# Patient Record
Sex: Female | Born: 1935 | Race: White | Hispanic: No | Smoking: Never smoker
Health system: Southern US, Community
[De-identification: ages and names within clinical notes are randomized; demographics above are authoritative.]

## PROBLEM LIST (undated history)

## (undated) DIAGNOSIS — I10 Essential (primary) hypertension: Secondary | ICD-10-CM

## (undated) DIAGNOSIS — R0989 Other specified symptoms and signs involving the circulatory and respiratory systems: Secondary | ICD-10-CM

## (undated) DIAGNOSIS — M8580 Other specified disorders of bone density and structure, unspecified site: Secondary | ICD-10-CM

## (undated) DIAGNOSIS — E7800 Pure hypercholesterolemia, unspecified: Secondary | ICD-10-CM

## (undated) HISTORY — DX: Other specified symptoms and signs involving the circulatory and respiratory systems: R09.89

## (undated) HISTORY — DX: Other specified disorders of bone density and structure, unspecified site: M85.80

## (undated) HISTORY — DX: Pure hypercholesterolemia, unspecified: E78.00

## (undated) HISTORY — DX: Essential (primary) hypertension: I10

---
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
## 1968-08-24 HISTORY — PX: OTHER SURGICAL HISTORY: SHX169

## 1998-08-24 LAB — HM DEXA SCAN

## 2010-05-27 LAB — HM PAP SMEAR: HM Pap smear: NORMAL

## 2011-05-14 ENCOUNTER — Ambulatory Visit: Payer: Self-pay | Admitting: Family Medicine

## 2011-05-14 IMAGING — US US CAROTID DUPLEX BILAT
1 series · 14 of 24 positions shown · non-contrast
Comparison: none

REASON FOR EXAM: left bruit
COMMENTS:

[Series 1: us carotid duplex bilat · 0.07mm/px · 14 of 47 slices shown]
[im 1/47]
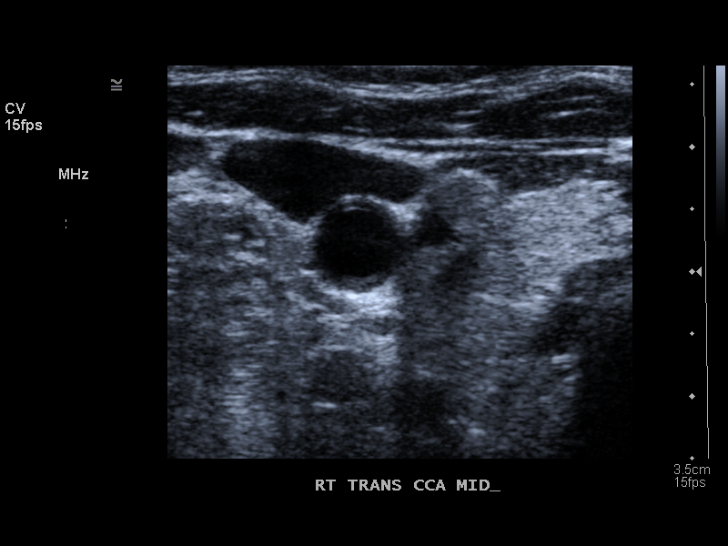
[im 5/47]
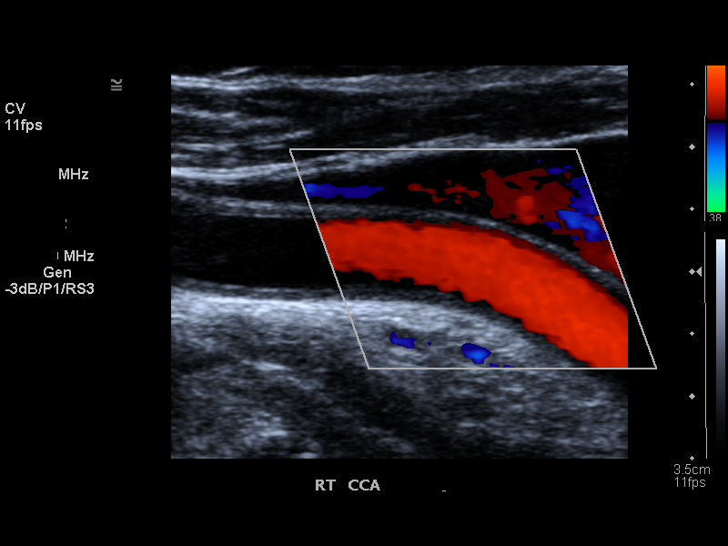
[im 9/47]
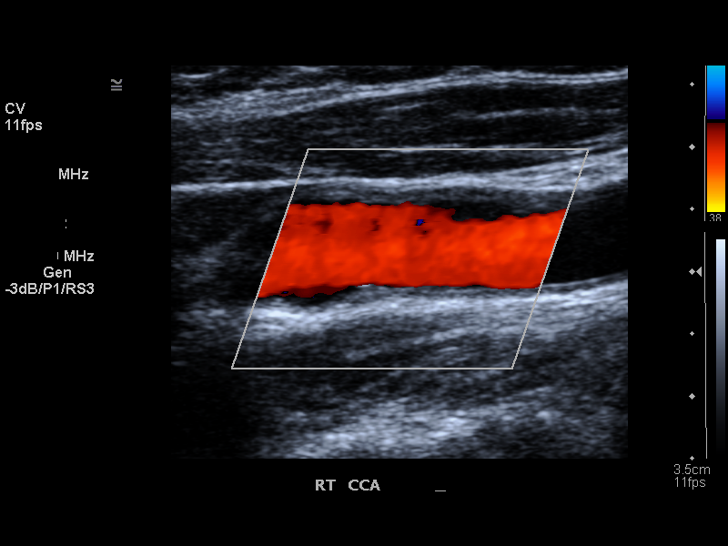
[im 13/47]
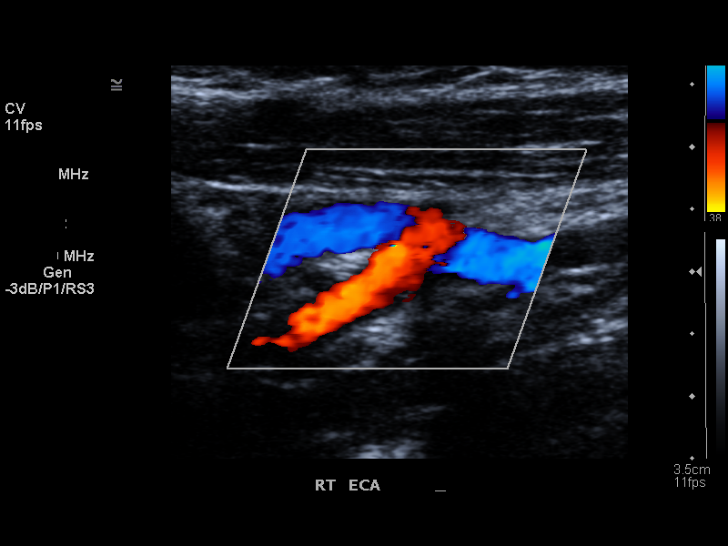
[im 15/47]
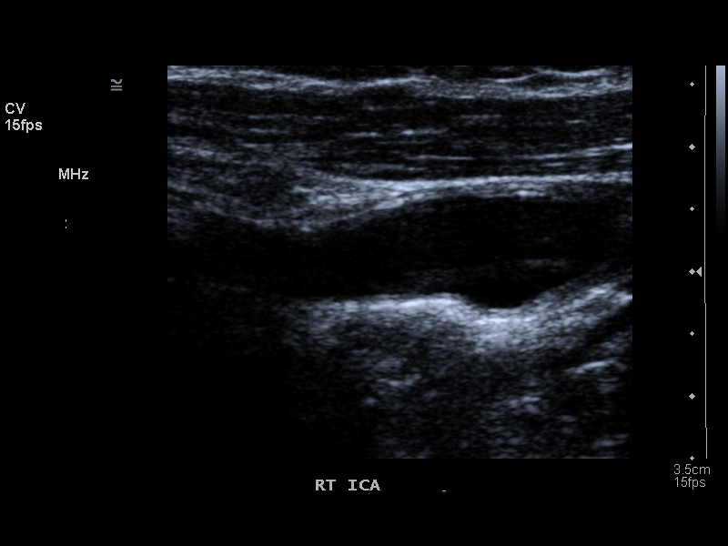
[im 19/47]
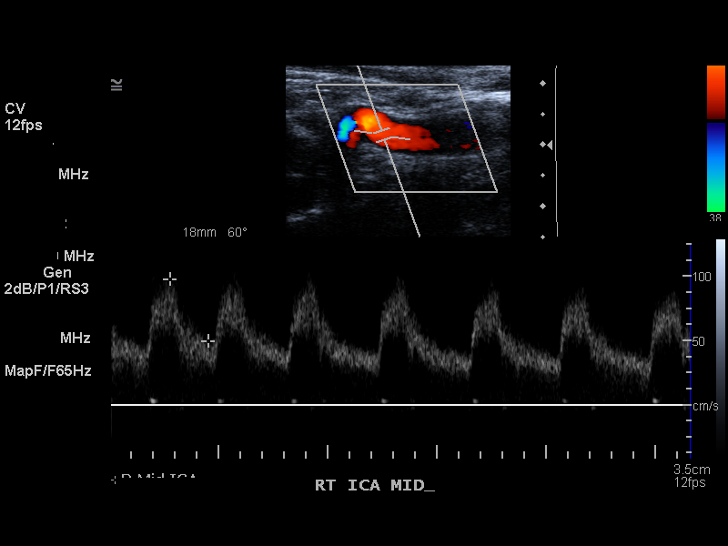
[im 23/47]
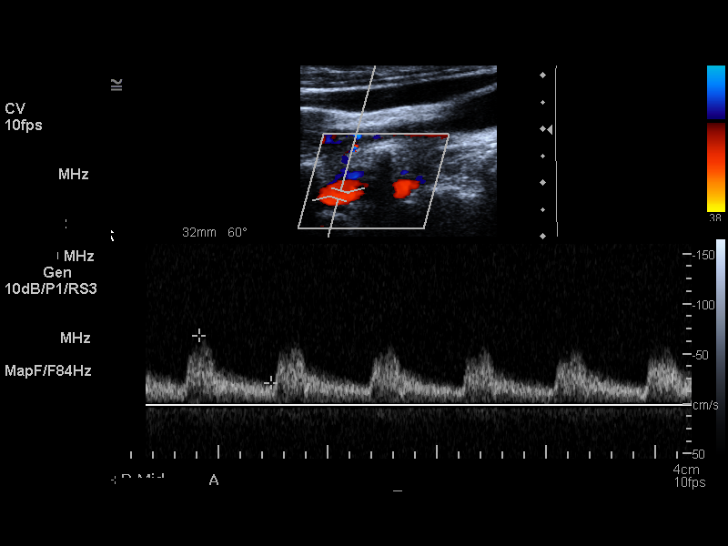
[im 25/47]
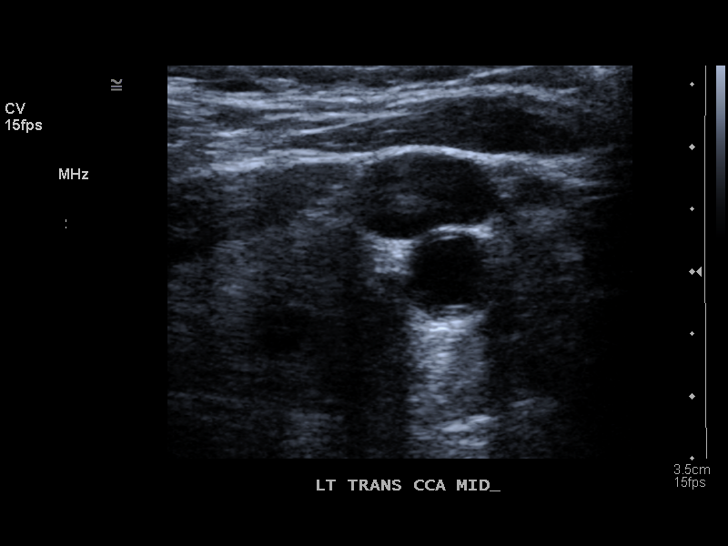
[im 29/47]
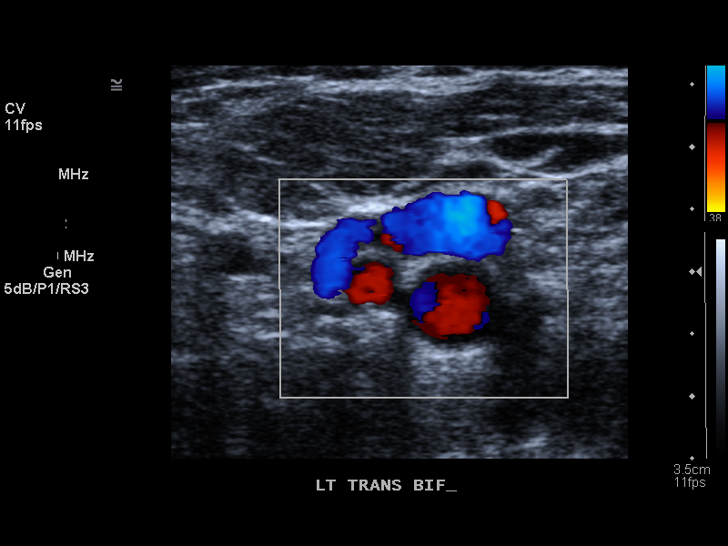
[im 33/47]
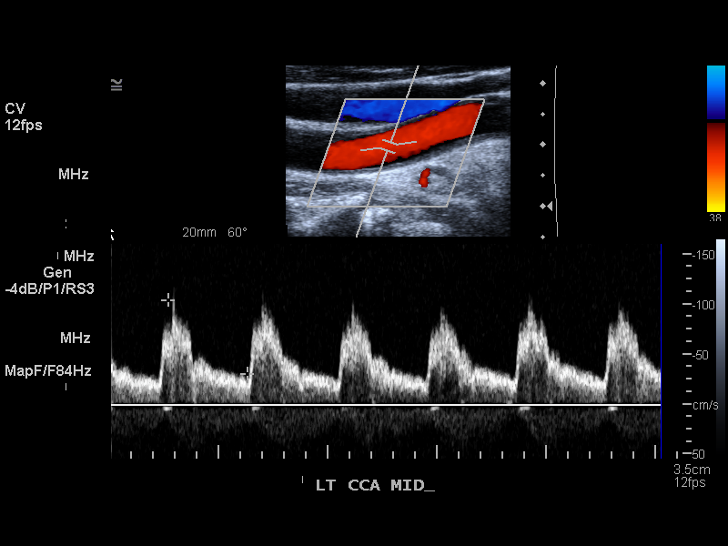
[im 37/47]
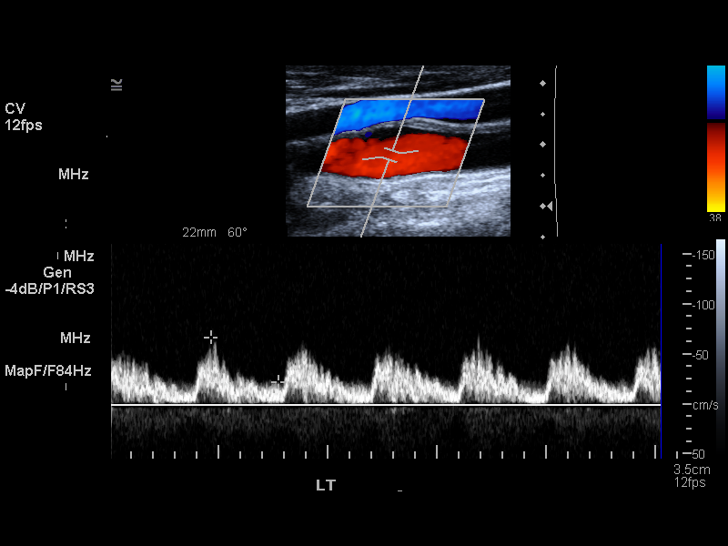
[im 39/47]
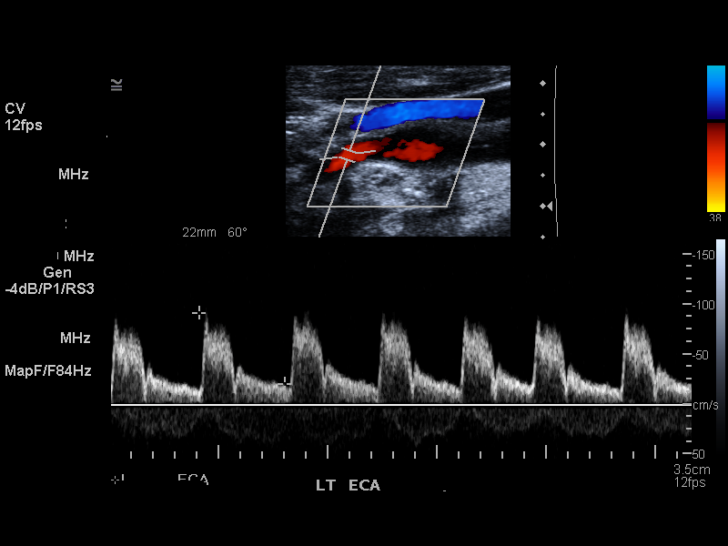
[im 43/47]
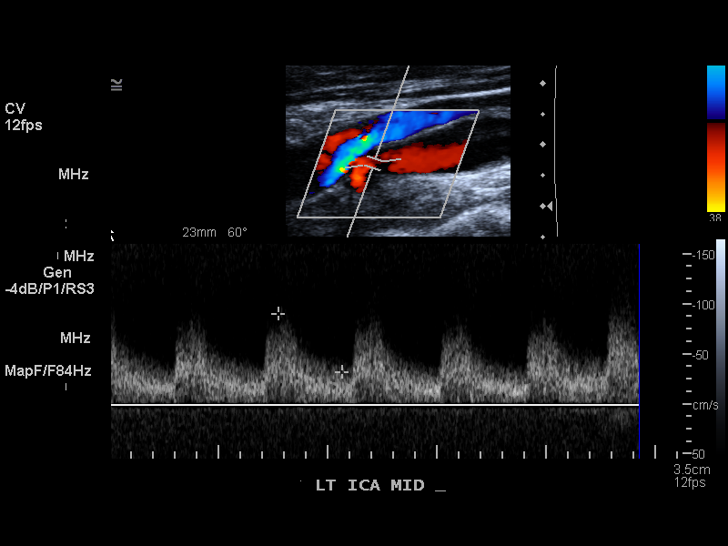
[im 47/47]
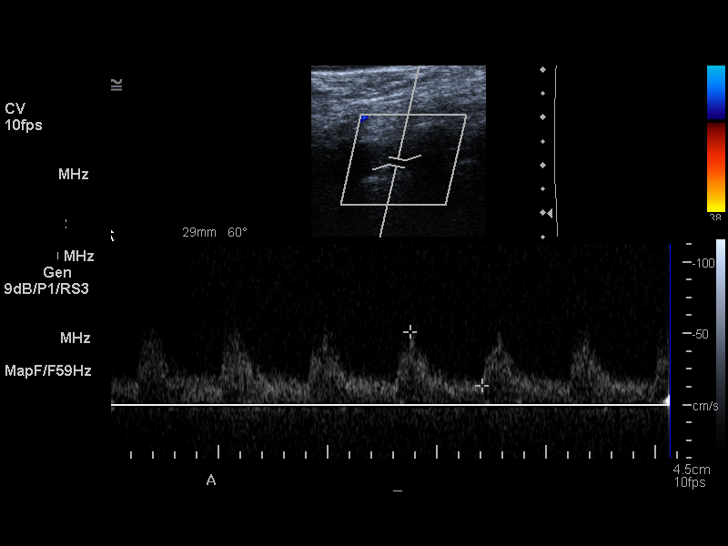

[14 of 24 positions shown; findings below may reference images not displayed]

PROCEDURE:     US  - US CAROTID DOPPLER BILATERAL  - [DATE]  [DATE]

RESULT:     Carotid Doppler interrogation demonstrates the presence of
intimal thickening and atherosclerotic plaque which appears relatively
smooth. Visually, there is no evidence of significant stenosis. Antegrade
flow is seen in the carotids and vertebrals bilaterally without flow
reversal. The peak systolic velocities appear normal bilaterally. The
internal to common carotid peak systolic velocity ratio is 0.98 on the right
and 0.72 on the left.
IMPRESSION: Atherosclerotic disease without evidence of hemodynamically
significant stenosis.

## 2011-12-08 LAB — HM MAMMOGRAPHY: HM Mammogram: NEGATIVE

## 2012-07-15 ENCOUNTER — Encounter: Payer: Self-pay | Admitting: Family Medicine

## 2012-09-12 ENCOUNTER — Encounter: Payer: Self-pay | Admitting: *Deleted

## 2015-01-03 ENCOUNTER — Emergency Department
Admission: EM | Admit: 2015-01-03 | Discharge: 2015-01-03 | Disposition: A | Discharge status: Home / Self Care | Payer: Medicare Other | Attending: Student | Admitting: Student

## 2015-01-03 DIAGNOSIS — I10 Essential (primary) hypertension: Secondary | ICD-10-CM | POA: Diagnosis not present

## 2015-01-03 DIAGNOSIS — Y998 Other external cause status: Secondary | ICD-10-CM | POA: Diagnosis not present

## 2015-01-03 DIAGNOSIS — Z203 Contact with and (suspected) exposure to rabies: Secondary | ICD-10-CM | POA: Insufficient documentation

## 2015-01-03 DIAGNOSIS — Z23 Encounter for immunization: Secondary | ICD-10-CM | POA: Insufficient documentation

## 2015-01-03 DIAGNOSIS — Y9389 Activity, other specified: Secondary | ICD-10-CM | POA: Diagnosis not present

## 2015-01-03 DIAGNOSIS — W5501XA Bitten by cat, initial encounter: Secondary | ICD-10-CM | POA: Insufficient documentation

## 2015-01-03 DIAGNOSIS — Z7982 Long term (current) use of aspirin: Secondary | ICD-10-CM | POA: Insufficient documentation

## 2015-01-03 DIAGNOSIS — Z79899 Other long term (current) drug therapy: Secondary | ICD-10-CM | POA: Insufficient documentation

## 2015-01-03 DIAGNOSIS — S61451A Open bite of right hand, initial encounter: Secondary | ICD-10-CM | POA: Diagnosis present

## 2015-01-03 DIAGNOSIS — Y9289 Other specified places as the place of occurrence of the external cause: Secondary | ICD-10-CM | POA: Diagnosis not present

## 2015-01-03 MED ORDER — TETANUS-DIPHTH-ACELL PERTUSSIS 5-2.5-18.5 LF-MCG/0.5 IM SUSP
0.5000 mL | Freq: Once | INTRAMUSCULAR | Status: AC
  Administered 2015-01-03: 0.5 mL via INTRAMUSCULAR

## 2015-01-03 MED ORDER — AMOXICILLIN-POT CLAVULANATE 875-125 MG PO TABS: 1.0000 | ORAL_TABLET | Freq: Two times a day (BID) | ORAL | Status: AC

## 2015-01-03 MED ORDER — RABIES VACCINE, PCEC IM SUSR
1.0000 mL | Freq: Once | INTRAMUSCULAR | Status: AC
  Administered 2015-01-03: 1 mL via INTRAMUSCULAR
  Filled 2015-01-03: qty 1

## 2015-01-03 MED ORDER — RABIES VACCINE, PCEC IM SUSR
INTRAMUSCULAR | Status: AC
  Administered 2015-01-03: 1 mL via INTRAMUSCULAR
  Filled 2015-01-03: qty 1

## 2015-01-03 MED ORDER — RABIES IMMUNE GLOBULIN 150 UNIT/ML IM INJ
20.0000 [IU]/kg | INJECTION | Freq: Once | INTRAMUSCULAR | Status: AC
  Administered 2015-01-03: 300 [IU] via INTRAMUSCULAR
  Filled 2015-01-03: qty 8

## 2015-01-03 MED ORDER — TETANUS-DIPHTH-ACELL PERTUSSIS 5-2.5-18.5 LF-MCG/0.5 IM SUSP
INTRAMUSCULAR | Status: AC
  Administered 2015-01-03: 0.5 mL via INTRAMUSCULAR
  Filled 2015-01-03: qty 0.5

## 2015-01-03 NOTE — ED Notes (Signed)
Animal control notified 

## 2015-01-03 NOTE — ED Notes (Addendum)
Patient presents to the ED with reports of a cat bite to the dorsal surface of her RIGHT hand. Patient reports that animal was unknown to her. Small puncture noted with bruising. NOS reported by patient at this time. Patient states, "I am nervous. I think my BP is going to be up a little."

## 2015-01-03 NOTE — ED Provider Notes (Signed)
[facility] Emergency Department Provider Note  ____________________________________________  Time seen: Approximately 8:00 AM  I have reviewed the triage vital signs and the nursing notes.   HISTORY  Chief Complaint Animal Bite   HPI Susan Moss is a 81 y.o. female resents to the ER due to complaint of cat bite to right hand last night. Reports she was outside of church and a small gray cat came up to her meowing. patient states she then bent down to touch and pet cat. Patient states that as she was petting the cat it turned around and bit her right hand once then released.   States the cat was very well-appearing. patient states that she presents today as her son-in-law told her that she needed to be seen and get antibiotics and rabies immunization. Patient denies pain. Patient denies decreased range of motion to the right hand,  tingling, numbness or pain.  she denies fever or other complaints.  Patient states that this is unknown cat and a stray. Reports her tetanus shot is not up-to-date. denies fall or other injury.    Past Medical History  Diagnosis Date  . Pure hypercholesterolemia   . Osteopenia   . Bruit of left carotid artery     carotid doppler 04/2011 mild plaque formation only  . Unspecified essential hypertension     There are no active problems to display for this patient.   Past Surgical History  Procedure Laterality Date  . Tonsillectomy  1943  . Cyst removed  1970   PCP: Chaya Jan  Current Outpatient Rx  Name  Route  Sig  Dispense  Refill  . aspirin EC 81 MG tablet   Oral   Take 81 mg by mouth daily.         . calcium-vitamin D (OSCAL) 250-125 MG-UNIT per tablet   Oral   Take 1 tablet by mouth daily.         . cholecalciferol (VITAMIN D) 1000 UNITS tablet   Oral   Take 1,000 Units by mouth daily.         . cyanocobalamin 100 MCG tablet   Oral   Take 100 mcg by mouth daily.         Marland Kitchen lisinopril  (PRINIVIL,ZESTRIL) 5 MG tablet   Oral   Take 5 mg by mouth daily.         . Multiple Vitamins-Minerals (MULTI FOR HER PO)   Oral   Take by mouth daily.         . Omega-3 Fatty Acids (FISH OIL PO)   Oral   Take by mouth.         . simvastatin (ZOCOR) 20 MG tablet   Oral   Take 20 mg by mouth every evening.           Allergies Review of patient's allergies indicates no known allergies.  Family History  Problem Relation Age of Onset  . Heart disease Mother   . Heart disease Father   . Emphysema Father   . Heart disease Sister   . Diabetes Sister   . Heart disease Sister     heart attack  . Transient ischemic attack Sister   . Stroke Brother   . Emphysema Brother     Social History History  Substance Use Topics  . Smoking status: Never Smoker   . Smokeless tobacco: Not on file  . Alcohol Use: No    Review of Systems Constitutional: No fever/chills Eyes: No visual  changes. ENT: No sore throat. Cardiovascular: Denies chest pain. Respiratory: Denies shortness of breath. Gastrointestinal: No abdominal pain.  No nausea, no vomiting.  No diarrhea.  No constipation. Genitourinary: Negative for dysuria. Musculoskeletal: Negative for back pain. Skin: cat bite as above Neurological: Negative for headaches, focal weakness or numbness.  10-point ROS otherwise negative.  ____________________________________________   PHYSICAL EXAM:  VITAL SIGNS: ED Triage Vitals  Enc Vitals Group     BP 01/03/15 0657 174/91 mmHg     Pulse Rate 01/03/15 0657 89     Resp 01/03/15 0657 16     Temp 01/03/15 0655 98.6 F (37 C)     Temp Source 01/03/15 0657 Oral     SpO2 01/03/15 0657 99 %     Weight 01/03/15 0657 120 lb (54.432 kg)     Height 01/03/15 0657 5\' 1"  (1.549 m)     Head Cir --      Peak Flow --      Pain Score 01/03/15 0655 0     Pain Loc --      Pain Edu? --      Excl. in [place]? --    Blood pressure 160/80, pulse 87, temperature 98.6 F (37 C), temperature  source Oral, resp. rate 18, height 5\' 1"  (1.549 m), weight 120 lb (54.432 kg), SpO2 97 %.   Constitutional: Alert and oriented. Well appearing and in no acute distress. Eyes: Conjunctivae are normal.  Head: Atraumatic. Nose: No congestion/rhinnorhea. Mouth/Throat: Mucous membranes are moist.  Oropharynx non-erythematous. Neck: No stridor. No cervical spine tenderness to palpation. Hematological/Lymphatic/Immunilogical: No cervical lymphadenopathy. Cardiovascular: Normal rate, regular rhythm. Grossly normal heart sounds.  Good peripheral circulation. Respiratory: Normal respiratory effort.  No retractions. Lungs CTAB. Gastrointestinal: Soft and nontender. No distention. No abdominal bruits. No CVA tenderness. Musculoskeletal:  right hand nontender to palpation. Cap refill less than 2 seconds. Sensation and motor intact. No lower extremity tenderness nor edema.  No joint effusions.distal radial pulses equal bilaterally  Neurologic:  Normal speech and language. No gross focal neurologic deficits are appreciated. Speech is normal. No gait instability. Skin:  Skin is warm, dry and intact. No rash noted. Except: Right hand with Superficial abrasion present between first and second digits. no surrounding erythema, drainage, induration, fluctuance. SKin otherwise intact. Nontender to palpation. Full ROM.  Psychiatric: Mood and affect are normal. Speech and behavior are normal.  ____________________________________________  _________________________________________   INITIAL IMPRESSION / ASSESSMENT AND PLAN / ED COURSE  Pertinent labs & imaging results that were available during my care of the patient were reviewed by me and considered in my medical decision making (see chart for details). presents to the ER status post cat bite approximately 8pm last night at her church. Patient reports cat is a stray and unknown to her. Patient reports that she presents to the ER as her family encouraged her to  come for tetanus immunization and antibiotic and rabies immunizations. Animal control was not notified prior to arriving at the ER. Nurses in the ER contacted animal control. We will initiate rabies immunizations update tetanus shot and start patient on Augmentin by mouth to protect  against infection. Strict follow-up and return parameters. Patient to be seen by PCP within 2-3 days for wound check. If unable to be seen by PCP to return to ER. Clean with soap and water discuss follow-up and return parameters. Return to the ER on days 3, 7 and 14 for remaining rabies immunizations. Patient agreed to plan.  _________________________________________  FINAL CLINICAL IMPRESSION(S) / ED DIAGNOSES  Final diagnoses:  Cat bite of hand, right, initial encounter  Rabies, need for prophylactic vaccination against      Marylene Land, NP 01/03/15 9969  Joanne Gavel, MD 01/03/15 1455

## 2015-01-03 NOTE — Discharge Instructions (Signed)
Keep clean with soap and water. Apply topical antibiotic ointment such as neosporin daily. Take medication as prescribed.   REturn to ER on day 3, 7, and 14 for remaining rabies. Immunizations. Follow up with animal control.  Follow up with your primary Physician or return to ER in 2-3 days for a wound check.  Return to ER sooner for new or worsening concerns.   Animal Bite An animal bite can result in a scratch on the skin, deep open cut, puncture of the skin, crush injury, or tearing away of the skin or a body part. Dogs are responsible for most animal bites. Children are bitten more often than adults. An animal bite can range from very mild to more serious. A small bite from your house pet is no cause for alarm. However, some animal bites can become infected or injure a bone or other tissue. You must seek medical care if:  The skin is broken and bleeding does not slow down or stop after 15 minutes.  The puncture is deep and difficult to clean (such as a cat bite).  Pain, warmth, redness, or pus develops around the wound.  The bite is from a stray animal or rodent. There may be a risk of rabies infection.  The bite is from a snake, raccoon, skunk, fox, coyote, or bat. There may be a risk of rabies infection.  The person bitten has a chronic illness such as diabetes, liver disease, or cancer, or the person takes medicine that lowers the immune system.  There is concern about the location and severity of the bite. It is important to clean and protect an animal bite wound right away to prevent infection. Follow these steps:  Clean the wound with plenty of water and soap.  Apply an antibiotic cream.  Apply gentle pressure over the wound with a clean towel or gauze to slow or stop bleeding.  Elevate the affected area above the heart to help stop any bleeding.  Seek medical care. Getting medical care within 8 hours of the animal bite leads to the best possible outcome. DIAGNOSIS  Your  caregiver will most likely:  Take a detailed history of the animal and the bite injury.  Perform a wound exam.  Take your medical history. Blood tests or X-rays may be performed. Sometimes, infected bite wounds are cultured and sent to a lab to identify the infectious bacteria.  TREATMENT  Medical treatment will depend on the location and type of animal bite as well as the patient's medical history. Treatment may include:  Wound care, such as cleaning and flushing the wound with saline solution, bandaging, and elevating the affected area.  Antibiotics.  Tetanus immunization.  Rabies immunization.  Leaving the wound open to heal. This is often done with animal bites, due to the high risk of infection. However, in certain cases, wound closure with stitches, wound adhesive, skin adhesive strips, or staples may be used. Infected bites that are left untreated may require intravenous (IV) antibiotics and surgical treatment in the hospital. [place]  Follow your caregiver's instructions for wound care.  Take all medicines as directed.  If your caregiver prescribes antibiotics, take them as directed. Finish them even if you start to feel better.  Follow up with your caregiver for further exams or immunizations as directed. You may need a tetanus shot if:  You cannot remember when you had your last tetanus shot.  You have never had a tetanus shot.  The injury broke your skin.  If you get a tetanus shot, your arm may swell, get red, and feel warm to the touch. This is common and not a problem. If you need a tetanus shot and you choose not to have one, there is a rare chance of getting tetanus. Sickness from tetanus can be serious. SEEK MEDICAL CARE IF:  You notice warmth, redness, soreness, swelling, pus discharge, or a bad smell coming from the wound.  You have a red line on the skin coming from the wound.  You have a fever, chills, or a general ill feeling.  You  have nausea or vomiting.  You have continued or worsening pain.  You have trouble moving the injured part.  You have other questions or concerns. MAKE SURE YOU:  Understand these instructions.  Will watch your condition.  Will get help right away if you are not doing well or get worse. Document Released: 04/28/2011 Document Revised: 11/02/2011 Document Reviewed: 04/28/2011 [facility] Patient Information 2015 [place], [place]. This information is not intended to replace advice given to you by your health care provider. Make sure you discuss any questions you have with your health care provider.  Rabies  Rabies is a viral infection that can be spread to people from infected animals. The infection affects the brain and central nervous system. Once the disease develops, it almost always causes death. Because of this, when a person is bitten by an animal that may have rabies, treatment to prevent rabies often needs to be started whether or not the animal is known to be infected. Prompt treatment with the rabies vaccine and rabies immune globulin is very effective at preventing the infection from developing in people who have been exposed to the rabies virus. CAUSES  Rabies is caused by a virus that lives inside some animals. When a person is bitten by an infected animal, the rabies virus is spread to the person through the infected spit (saliva) of the animal. This virus can be carried by animals such as dogs, cats, skunks, bats, woodchucks, raccoons, coyotes, and foxes. SYMPTOMS  By the time symptoms appear, rabies is usually fatal for the person. Common symptoms include:  Headache.  Fever.  Fatigue and weakness.  Agitation.  Anxiety.  Confusion.  Unusual behavior, such as hyperactivity, fear of water (hydrophobia), or fear of air (aerophobia).  Hallucinations.  Insomnia.  Weakness in the arms or legs.  Difficulty swallowing. Most people get sick in 1-3 months after being  bitten. This often varies and may depend on the location of the bite. The infection will take less time to develop if the bite occurred closer to the head.  DIAGNOSIS  To determine if a person is infected, several tests must be performed, such as:  A skin biopsy.  A saliva test.  A lumbar puncture to remove spinal fluid so it can be examined.  Blood tests. TREATMENT  Treatment to prevent the infection from developing (post-exposure prophylaxis, PEP) is often started before knowing for sure if the person has been exposed to the rabies virus. PEP involves cleaning the wound, giving an antibody injection (rabies immune globulin), and giving a series of rabies vaccine injections. The series of injections are usually given over a two-week period. If possible, the animal that bit the person will be observed to see if it remains healthy. If the animal has been killed, it can be sent to a state laboratory and examined to see if the animal had rabies. If a person is bitten by a Architect (  dog, cat, or ferret) that appears healthy and can be observed to see if it remains healthy, often no further treatment is necessary other than care of the wounds caused by the animal. Rabies is often a fatal illness once the infection develops in a person. Although a few people who developed rabies have survived after experimental treatment with certain drugs, all these survivors still had severe nervous system problems after the treatment. This is why caregivers use extra caution and begin PEP treatment for people who have been bitten by animals that are possibly infected with rabies.  HOME CARE INSTRUCTIONS  If you were bitten by an unknown animal, make sure you know your caregiver's instructions for follow-up. If the animal was sent to a laboratory for examination, ask when the test results will be ready. Make sure you get the test results.  Take these steps to care for your wound:  Keep the wound clean, dry, and  dressed as directed by your caregiver.  Keep the injured part elevated as much as possible.  Do not resume use of the affected area until directed.  Only take over-the-counter or prescription medicines as directed by your caregiver.  Keep all follow-up appointments as directed by your caregiver. PREVENTION  To prevent rabies, people need to reduce their risk of having contact with infected animals.   Make sure your pets (dogs, cats, ferrets) are vaccinated against rabies. Keep these vaccinations up-to-date as directed by your veterinarian.  Supervise your pets when they are outside. Keep them away from wild animals.  Call your local animal control services to report any stray animals. These animals may not be vaccinated.  Stay away from stray or wild animals.  Consider getting the rabies vaccine (preexposure) if you are traveling to an area where rabies is common or if your job or activities involve possible contact with wild or stray animals. Discuss this with your caregiver. Document Released: 08/10/2005 Document Revised: 05/04/2012 Document Reviewed: 03/08/2012 [facility] Patient Information 2015 [place], [place]. This information is not intended to replace advice given to you by your health care provider. Make sure you discuss any questions you have with your health care provider.

## 2015-01-05 ENCOUNTER — Emergency Department
Admission: EM | Admit: 2015-01-05 | Discharge: 2015-01-05 | Disposition: A | Discharge status: Home / Self Care | Payer: Medicare Other | Attending: Emergency Medicine | Admitting: Emergency Medicine

## 2015-01-05 ENCOUNTER — Encounter: Payer: Self-pay | Admitting: Emergency Medicine

## 2015-01-05 DIAGNOSIS — Z79899 Other long term (current) drug therapy: Secondary | ICD-10-CM | POA: Diagnosis not present

## 2015-01-05 DIAGNOSIS — Z203 Contact with and (suspected) exposure to rabies: Secondary | ICD-10-CM | POA: Insufficient documentation

## 2015-01-05 DIAGNOSIS — Z7982 Long term (current) use of aspirin: Secondary | ICD-10-CM | POA: Insufficient documentation

## 2015-01-05 DIAGNOSIS — I10 Essential (primary) hypertension: Secondary | ICD-10-CM | POA: Insufficient documentation

## 2015-01-05 DIAGNOSIS — W5501XD Bitten by cat, subsequent encounter: Secondary | ICD-10-CM | POA: Diagnosis not present

## 2015-01-05 DIAGNOSIS — S61451D Open bite of right hand, subsequent encounter: Secondary | ICD-10-CM | POA: Insufficient documentation

## 2015-01-05 MED ORDER — RABIES VACCINE, PCEC IM SUSR
1.0000 mL | Freq: Once | INTRAMUSCULAR | Status: AC
  Administered 2015-01-05: 1 mL via INTRAMUSCULAR

## 2015-01-05 MED ORDER — RABIES VACCINE, PCEC IM SUSR
INTRAMUSCULAR | Status: AC
  Filled 2015-01-05: qty 1

## 2015-01-05 NOTE — ED Provider Notes (Signed)
[facility] Emergency Department Provider Note  ____________________________________________  Time seen: 5  I have reviewed the triage vital signs and the nursing notes.   HISTORY  Chief Complaint Rabies Injection    HPI Susan Moss is a 81 y.o. female A she is here for her second rabies injection. She was bitten by a cat that was outside of a church. She was bitten on her right hand. She denies any complications with the first set of immunizations.   Past Medical History  Diagnosis Date  . Pure hypercholesterolemia   . Osteopenia   . Bruit of left carotid artery     carotid doppler 04/2011 mild plaque formation only  . Unspecified essential hypertension     There are no active problems to display for this patient.   Past Surgical History  Procedure Laterality Date  . Tonsillectomy  1943  . Cyst removed  1970    Current Outpatient Rx  Name  Route  Sig  Dispense  Refill  . amoxicillin-clavulanate (AUGMENTIN) 875-125 MG per tablet   Oral   Take 1 tablet by mouth every 12 (twelve) hours.   20 tablet   0   . aspirin EC 81 MG tablet   Oral   Take 81 mg by mouth daily.         . calcium-vitamin D (OSCAL) 250-125 MG-UNIT per tablet   Oral   Take 1 tablet by mouth daily.         . cholecalciferol (VITAMIN D) 1000 UNITS tablet   Oral   Take 1,000 Units by mouth daily.         . cyanocobalamin 100 MCG tablet   Oral   Take 100 mcg by mouth daily.         Marland Kitchen lisinopril (PRINIVIL,ZESTRIL) 5 MG tablet   Oral   Take 5 mg by mouth daily.         . Multiple Vitamins-Minerals (MULTI FOR HER PO)   Oral   Take by mouth daily.         . Omega-3 Fatty Acids (FISH OIL PO)   Oral   Take by mouth.         . simvastatin (ZOCOR) 20 MG tablet   Oral   Take 20 mg by mouth every evening.           Allergies Review of patient's allergies indicates no known allergies.  Family History  Problem Relation Age of Onset  . Heart  disease Mother   . Heart disease Father   . Emphysema Father   . Heart disease Sister   . Diabetes Sister   . Heart disease Sister     heart attack  . Transient ischemic attack Sister   . Stroke Brother   . Emphysema Brother     Social History History  Substance Use Topics  . Smoking status: Never Smoker   . Smokeless tobacco: Not on file  . Alcohol Use: No    Review of Systems Constitutional: No fever/chills Eyes: No visual changes. ENT: No sore throat. Cardiovascular: Denies chest pain. Respiratory: Denies shortness of breath. Genitourinary: Negative for dysuria. Musculoskeletal: Negative for back pain. Skin: Negative for rash. Neurological: Negative for headaches, focal weakness or numbness.  10-point ROS otherwise negative.  ____________________________________________   PHYSICAL EXAM:  VITAL SIGNS: ED Triage Vitals  Enc Vitals Group     BP 01/05/15 0730 171/75 mmHg     Pulse Rate 01/05/15 0730 78  Resp 01/05/15 0730 18     Temp 01/05/15 0730 97.7 F (36.5 C)     Temp Source 01/05/15 0730 Oral     SpO2 01/05/15 0730 97 %     Weight 01/05/15 0730 120 lb (54.432 kg)     Height 01/05/15 0730 5\' 1"  (1.549 m)     Head Cir --      Peak Flow --      Pain Score 01/05/15 0730 0     Pain Loc --      Pain Edu? --      Excl. in [place]? --     Constitutional: Alert and oriented. Well appearing and in no acute distress. Eyes: Conjunctivae are normal. PERRL. EOMI. Head: Atraumatic. Nose: No congestion/rhinnorhea. Neck: No stridor.   Cardiovascular: Normal rate, regular rhythm. Grossly normal heart sounds.  Good peripheral circulation. Respiratory: Normal respiratory effort.  No retractions. Lungs CTAB. Gastrointestinal: Soft and nontender. No distention. No abdominal bruits. No CVA tenderness. Musculoskeletal: No lower extremity tenderness nor edema.  No joint effusions. No difficulty with range of motion right hand. Neurologic:  Normal speech and language. No  gross focal neurologic deficits are appreciated. Speech is normal. No gait instability. Skin:  Skin is warm, dry and intact. No rash noted. Right hand Bite area appears to be healing without any signs of infection Psychiatric: Mood and affect are normal. Speech and behavior are normal.  ____________________________________________   LABS (all labs ordered are listed, but only abnormal results are displayed)  Labs Reviewed - No data to display ____________________________________________ PROCEDURES  Procedure(s) performed: None  Critical Care performed: No  ____________________________________________   INITIAL IMPRESSION / ASSESSMENT AND PLAN / ED COURSE  Pertinent labs & imaging results that were available during my care of the patient were reviewed by me and considered in my medical decision making (see chart for details).  Patient was given her second immunization. She is to return for the remaining scheduled immunizations. ____________________________________________   FINAL CLINICAL IMPRESSION(S) / ED DIAGNOSES  Final diagnoses:  Cat bite of hand, right, subsequent encounter      Johnn Hai, PA-C 01/05/15 0906  Johnn Hai, PA-C 01/05/15 [place], MD 01/05/15 1133

## 2015-01-05 NOTE — ED Notes (Signed)
Here for 2nd rabies shot 

## 2015-01-10 ENCOUNTER — Encounter: Payer: Self-pay | Admitting: Emergency Medicine

## 2015-01-10 ENCOUNTER — Emergency Department
Admission: EM | Admit: 2015-01-10 | Discharge: 2015-01-10 | Disposition: A | Discharge status: Home / Self Care | Payer: Medicare Other | Attending: Emergency Medicine | Admitting: Emergency Medicine

## 2015-01-10 DIAGNOSIS — S61459D Open bite of unspecified hand, subsequent encounter: Secondary | ICD-10-CM | POA: Insufficient documentation

## 2015-01-10 DIAGNOSIS — W5501XD Bitten by cat, subsequent encounter: Secondary | ICD-10-CM | POA: Insufficient documentation

## 2015-01-10 DIAGNOSIS — Z203 Contact with and (suspected) exposure to rabies: Secondary | ICD-10-CM | POA: Diagnosis not present

## 2015-01-10 DIAGNOSIS — Z79899 Other long term (current) drug therapy: Secondary | ICD-10-CM | POA: Insufficient documentation

## 2015-01-10 DIAGNOSIS — Z23 Encounter for immunization: Secondary | ICD-10-CM | POA: Diagnosis present

## 2015-01-10 MED ORDER — RABIES VACCINE, PCEC IM SUSR
1.0000 mL | Freq: Once | INTRAMUSCULAR | Status: AC
  Administered 2015-01-10: 1 mL via INTRAMUSCULAR

## 2015-01-10 MED ORDER — RABIES VACCINE, PCEC IM SUSR
INTRAMUSCULAR | Status: AC
  Filled 2015-01-10: qty 1

## 2015-01-10 NOTE — ED Notes (Signed)
Here for f/u rabies shot

## 2015-01-10 NOTE — ED Provider Notes (Signed)
[facility] Emergency Department Provider Note  ____________________________________________  Time seen: 924  I have reviewed the triage vital signs and the nursing notes.   HISTORY  Chief Complaint Rabies Injection   HPI Susan Moss is a 81 y.o. female is here today for her third rabies injection out of 4. She denies any pain with her hand after her cat bite.  She denies any pain and has had no reaction from her immunizations thus far.   Past Medical History  Diagnosis Date  . Pure hypercholesterolemia   . Osteopenia   . Bruit of left carotid artery     carotid doppler 04/2011 mild plaque formation only  . Unspecified essential hypertension     There are no active problems to display for this patient.   Past Surgical History  Procedure Laterality Date  . Tonsillectomy  1943  . Cyst removed  1970    Current Outpatient Rx  Name  Route  Sig  Dispense  Refill  . amoxicillin-clavulanate (AUGMENTIN) 875-125 MG per tablet   Oral   Take 1 tablet by mouth every 12 (twelve) hours.   20 tablet   0   . aspirin EC 81 MG tablet   Oral   Take 81 mg by mouth daily.         . calcium-vitamin D (OSCAL) 250-125 MG-UNIT per tablet   Oral   Take 1 tablet by mouth daily.         . cholecalciferol (VITAMIN D) 1000 UNITS tablet   Oral   Take 1,000 Units by mouth daily.         . cyanocobalamin 100 MCG tablet   Oral   Take 100 mcg by mouth daily.         Marland Kitchen lisinopril (PRINIVIL,ZESTRIL) 5 MG tablet   Oral   Take 5 mg by mouth daily.         . Multiple Vitamins-Minerals (MULTI FOR HER PO)   Oral   Take by mouth daily.         . Omega-3 Fatty Acids (FISH OIL PO)   Oral   Take by mouth.         . simvastatin (ZOCOR) 20 MG tablet   Oral   Take 20 mg by mouth every evening.           Allergies Review of patient's allergies indicates no known allergies.  Family History  Problem Relation Age of Onset  . Heart disease Mother    . Heart disease Father   . Emphysema Father   . Heart disease Sister   . Diabetes Sister   . Heart disease Sister     heart attack  . Transient ischemic attack Sister   . Stroke Brother   . Emphysema Brother     Social History History  Substance Use Topics  . Smoking status: Never Smoker   . Smokeless tobacco: Not on file  . Alcohol Use: No    Review of Systems Constitutional: No fever/chills Cardiovascular: Denies chest pain. Respiratory: Denies shortness of breath. Gastrointestinal: No abdominal pain.  No nausea, no vomiting. Genitourinary: Negative for dysuria. Musculoskeletal: Negative for back pain. Skin: Negative for rash.  Neurological: Negative for headaches 10-point ROS otherwise negative.  ____________________________________________   PHYSICAL EXAM:  VITAL SIGNS: ED Triage Vitals  Enc Vitals Group     BP 01/10/15 0844 169/82 mmHg     Pulse Rate 01/10/15 0844 74     Resp 01/10/15 0844 20  Temp 01/10/15 0844 98.2 F (36.8 C)     Temp Source 01/10/15 0844 Oral     SpO2 01/10/15 0844 98 %     Weight 01/10/15 0844 120 lb (54.432 kg)     Height 01/10/15 0844 5' (1.524 m)     Head Cir --      Peak Flow --      Pain Score --      Pain Loc --      Pain Edu? --      Excl. in [place]? --     Constitutional: Alert and oriented. Well appearing and in no acute distress. Eyes: Conjunctivae are normal. Head: Atraumatic. Nose: No congestion/rhinnorhea. Neck: No stridor.   Cardiovascular: Normal rate, regular rhythm. Grossly normal heart sounds. Respiratory: Normal respiratory effort.  Gastrointestinal: Soft and nontender. No distention.  Musculoskeletal: No lower extremity tenderness nor edema.  No joint effusions. Neurologic:  Normal speech and language. No gross focal neurologic deficits are appreciated. Speech is normal. No gait instability. Skin:  Skin is warm, dry and intact. No signs of infection at the bite site. Psychiatric: Mood and affect are  normal. Speech and behavior are normal.  ____________________________________________   LABS (all labs ordered are listed, but only abnormal results are displayed)  Labs Reviewed - No data to display  PROCEDURES  Procedure(s) performed: None  Critical Care performed: No  ____________________________________________   INITIAL IMPRESSION / ASSESSMENT AND PLAN / ED COURSE  Pertinent labs & imaging results that were available during my care of the patient were reviewed by me and considered in my medical decision making (see chart for details).  Patient was told to return for her last rabies immunization. ____________________________________________   FINAL CLINICAL IMPRESSION(S) / ED DIAGNOSES  Final diagnoses:  Need for prophylactic vaccination and inoculation against rabies  Cat bite, subsequent encounter      Johnn Hai, PA-C 01/10/15 [place], PA-C 01/10/15 [place], MD 01/10/15 [phone]

## 2015-01-10 NOTE — ED Notes (Signed)
Here for rabies vaccine  denies any other c/os

## 2015-01-17 ENCOUNTER — Emergency Department
Admission: EM | Admit: 2015-01-17 | Discharge: 2015-01-17 | Disposition: A | Discharge status: Home / Self Care | Payer: Medicare Other | Attending: Emergency Medicine | Admitting: Emergency Medicine

## 2015-01-17 DIAGNOSIS — Z23 Encounter for immunization: Secondary | ICD-10-CM | POA: Insufficient documentation

## 2015-01-17 DIAGNOSIS — Z79899 Other long term (current) drug therapy: Secondary | ICD-10-CM | POA: Diagnosis not present

## 2015-01-17 DIAGNOSIS — I10 Essential (primary) hypertension: Secondary | ICD-10-CM | POA: Diagnosis not present

## 2015-01-17 DIAGNOSIS — Z203 Contact with and (suspected) exposure to rabies: Secondary | ICD-10-CM | POA: Diagnosis not present

## 2015-01-17 DIAGNOSIS — W5501XD Bitten by cat, subsequent encounter: Secondary | ICD-10-CM | POA: Diagnosis not present

## 2015-01-17 DIAGNOSIS — T148 Other injury of unspecified body region: Secondary | ICD-10-CM | POA: Insufficient documentation

## 2015-01-17 DIAGNOSIS — Z7982 Long term (current) use of aspirin: Secondary | ICD-10-CM | POA: Insufficient documentation

## 2015-01-17 MED ORDER — RABIES VACCINE, PCEC IM SUSR
1.0000 mL | Freq: Once | INTRAMUSCULAR | Status: AC
  Administered 2015-01-17: 1 mL via INTRAMUSCULAR

## 2015-01-17 MED ORDER — RABIES VACCINE, PCEC IM SUSR
INTRAMUSCULAR | Status: AC
  Administered 2015-01-17: 1 mL via INTRAMUSCULAR
  Filled 2015-01-17: qty 1

## 2015-01-17 NOTE — ED Notes (Signed)
Unable to sign E-signature at this time.  D/C instructions reviewed, pt verbalizes understanding, no questions or concerns at this time.

## 2015-01-17 NOTE — Discharge Instructions (Signed)
Follow-up with her primary care physician as needed. Return to the ER for new or worsening concerns.  Animal Bite An animal bite can result in a scratch on the skin, deep open cut, puncture of the skin, crush injury, or tearing away of the skin or a body part. Dogs are responsible for most animal bites. Children are bitten more often than adults. An animal bite can range from very mild to more serious. A small bite from your house pet is no cause for alarm. However, some animal bites can become infected or injure a bone or other tissue. You must seek medical care if:  The skin is broken and bleeding does not slow down or stop after 15 minutes.  The puncture is deep and difficult to clean (such as a cat bite).  Pain, warmth, redness, or pus develops around the wound.  The bite is from a stray animal or rodent. There may be a risk of rabies infection.  The bite is from a snake, raccoon, skunk, fox, coyote, or bat. There may be a risk of rabies infection.  The person bitten has a chronic illness such as diabetes, liver disease, or cancer, or the person takes medicine that lowers the immune system.  There is concern about the location and severity of the bite. It is important to clean and protect an animal bite wound right away to prevent infection. Follow these steps:  Clean the wound with plenty of water and soap.  Apply an antibiotic cream.  Apply gentle pressure over the wound with a clean towel or gauze to slow or stop bleeding.  Elevate the affected area above the heart to help stop any bleeding.  Seek medical care. Getting medical care within 8 hours of the animal bite leads to the best possible outcome. DIAGNOSIS  Your caregiver will most likely:  Take a detailed history of the animal and the bite injury.  Perform a wound exam.  Take your medical history. Blood tests or X-rays may be performed. Sometimes, infected bite wounds are cultured and sent to a lab to identify the  infectious bacteria.  TREATMENT  Medical treatment will depend on the location and type of animal bite as well as the patient's medical history. Treatment may include:  Wound care, such as cleaning and flushing the wound with saline solution, bandaging, and elevating the affected area.  Antibiotics.  Tetanus immunization.  Rabies immunization.  Leaving the wound open to heal. This is often done with animal bites, due to the high risk of infection. However, in certain cases, wound closure with stitches, wound adhesive, skin adhesive strips, or staples may be used. Infected bites that are left untreated may require intravenous (IV) antibiotics and surgical treatment in the hospital. [place]  Follow your caregiver's instructions for wound care.  Take all medicines as directed.  If your caregiver prescribes antibiotics, take them as directed. Finish them even if you start to feel better.  Follow up with your caregiver for further exams or immunizations as directed. You may need a tetanus shot if:  You cannot remember when you had your last tetanus shot.  You have never had a tetanus shot.  The injury broke your skin. If you get a tetanus shot, your arm may swell, get red, and feel warm to the touch. This is common and not a problem. If you need a tetanus shot and you choose not to have one, there is a rare chance of getting tetanus. Sickness from tetanus can  be serious. SEEK MEDICAL CARE IF:  You notice warmth, redness, soreness, swelling, pus discharge, or a bad smell coming from the wound.  You have a red line on the skin coming from the wound.  You have a fever, chills, or a general ill feeling.  You have nausea or vomiting.  You have continued or worsening pain.  You have trouble moving the injured part.  You have other questions or concerns. MAKE SURE YOU:  Understand these instructions.  Will watch your condition.  Will get help right away if you  are not doing well or get worse. Document Released: 04/28/2011 Document Revised: 11/02/2011 Document Reviewed: 04/28/2011 [facility] Patient Information 2015 [place], [place]. This information is not intended to replace advice given to you by your health care provider. Make sure you discuss any questions you have with your health care provider.   Rabies  Rabies is a viral infection that can be spread to people from infected animals. The infection affects the brain and central nervous system. Once the disease develops, it almost always causes death. Because of this, when a person is bitten by an animal that may have rabies, treatment to prevent rabies often needs to be started whether or not the animal is known to be infected. Prompt treatment with the rabies vaccine and rabies immune globulin is very effective at preventing the infection from developing in people who have been exposed to the rabies virus. CAUSES  Rabies is caused by a virus that lives inside some animals. When a person is bitten by an infected animal, the rabies virus is spread to the person through the infected spit (saliva) of the animal. This virus can be carried by animals such as dogs, cats, skunks, bats, woodchucks, raccoons, coyotes, and foxes. SYMPTOMS  By the time symptoms appear, rabies is usually fatal for the person. Common symptoms include:  Headache.  Fever.  Fatigue and weakness.  Agitation.  Anxiety.  Confusion.  Unusual behavior, such as hyperactivity, fear of water (hydrophobia), or fear of air (aerophobia).  Hallucinations.  Insomnia.  Weakness in the arms or legs.  Difficulty swallowing. Most people get sick in 1-3 months after being bitten. This often varies and may depend on the location of the bite. The infection will take less time to develop if the bite occurred closer to the head.  DIAGNOSIS  To determine if a person is infected, several tests must be performed, such as:  A skin  biopsy.  A saliva test.  A lumbar puncture to remove spinal fluid so it can be examined.  Blood tests. TREATMENT  Treatment to prevent the infection from developing (post-exposure prophylaxis, PEP) is often started before knowing for sure if the person has been exposed to the rabies virus. PEP involves cleaning the wound, giving an antibody injection (rabies immune globulin), and giving a series of rabies vaccine injections. The series of injections are usually given over a two-week period. If possible, the animal that bit the person will be observed to see if it remains healthy. If the animal has been killed, it can be sent to a state laboratory and examined to see if the animal had rabies. If a person is bitten by a domestic animal (dog, cat, or ferret) that appears healthy and can be observed to see if it remains healthy, often no further treatment is necessary other than care of the wounds caused by the animal. Rabies is often a fatal illness once the infection develops in a person. Although a few  people who developed rabies have survived after experimental treatment with certain drugs, all these survivors still had severe nervous system problems after the treatment. This is why caregivers use extra caution and begin PEP treatment for people who have been bitten by animals that are possibly infected with rabies.  HOME CARE INSTRUCTIONS  If you were bitten by an unknown animal, make sure you know your caregiver's instructions for follow-up. If the animal was sent to a laboratory for examination, ask when the test results will be ready. Make sure you get the test results.  Take these steps to care for your wound:  Keep the wound clean, dry, and dressed as directed by your caregiver.  Keep the injured part elevated as much as possible.  Do not resume use of the affected area until directed.  Only take over-the-counter or prescription medicines as directed by your caregiver.  Keep all  follow-up appointments as directed by your caregiver. PREVENTION  To prevent rabies, people need to reduce their risk of having contact with infected animals.   Make sure your pets (dogs, cats, ferrets) are vaccinated against rabies. Keep these vaccinations up-to-date as directed by your veterinarian.  Supervise your pets when they are outside. Keep them away from wild animals.  Call your local animal control services to report any stray animals. These animals may not be vaccinated.  Stay away from stray or wild animals.  Consider getting the rabies vaccine (preexposure) if you are traveling to an area where rabies is common or if your job or activities involve possible contact with wild or stray animals. Discuss this with your caregiver. Document Released: 08/10/2005 Document Revised: 05/04/2012 Document Reviewed: 03/08/2012 [facility] Patient Information 2015 [place], [place]. This information is not intended to replace advice given to you by your health care provider. Make sure you discuss any questions you have with your health care provider.

## 2015-01-17 NOTE — ED Provider Notes (Signed)
[facility] Emergency Department Provider Note  ____________________________________________  Time seen: Approximately 10:47 AM  I have reviewed the triage vital signs and the nursing notes.   HISTORY  Chief Complaint No chief complaint on file.   HPI Cyerra Yim is a 81 y.o. female presents to the ER for #4 out of 4 rabies immunizations. Patient was bitten by a stray cat 2 weeks ago and at that time rabies immunizations was initiated and patient was treated with oral antibiotics. Patient reports wound has fully healed without pain or complication. Reports she has been tolerating immunizations well without difficulty. Denies complaints at this time.   Past Medical History  Diagnosis Date  . Pure hypercholesterolemia   . Osteopenia   . Bruit of left carotid artery     carotid doppler 04/2011 mild plaque formation only  . Unspecified essential hypertension     There are no active problems to display for this patient.   Past Surgical History  Procedure Laterality Date  . Tonsillectomy  1943  . Cyst removed  1970    Current Outpatient Rx  Name  Route  Sig  Dispense  Refill  . aspirin EC 81 MG tablet   Oral   Take 81 mg by mouth daily.         . calcium-vitamin D (OSCAL) 250-125 MG-UNIT per tablet   Oral   Take 1 tablet by mouth daily.         . cholecalciferol (VITAMIN D) 1000 UNITS tablet   Oral   Take 1,000 Units by mouth daily.         . cyanocobalamin 100 MCG tablet   Oral   Take 100 mcg by mouth daily.         Marland Kitchen lisinopril (PRINIVIL,ZESTRIL) 5 MG tablet   Oral   Take 5 mg by mouth daily.         . Multiple Vitamins-Minerals (MULTI FOR HER PO)   Oral   Take by mouth daily.         . Omega-3 Fatty Acids (FISH OIL PO)   Oral   Take by mouth.         . simvastatin (ZOCOR) 20 MG tablet   Oral   Take 20 mg by mouth every evening.           Allergies Review of patient's allergies indicates no known  allergies.  Family History  Problem Relation Age of Onset  . Heart disease Mother   . Heart disease Father   . Emphysema Father   . Heart disease Sister   . Diabetes Sister   . Heart disease Sister     heart attack  . Transient ischemic attack Sister   . Stroke Brother   . Emphysema Brother     Social History History  Substance Use Topics  . Smoking status: Never Smoker   . Smokeless tobacco: Not on file  . Alcohol Use: No    Review of Systems Constitutional: No fever/chills Eyes: No visual changes. ENT: No sore throat. Cardiovascular: Denies chest pain. Respiratory: Denies shortness of breath. Gastrointestinal: No abdominal pain.  No nausea, no vomiting.  No diarrhea.  No constipation. Genitourinary: Negative for dysuria. Musculoskeletal: Negative for back pain. Skin: Negative for rash. Neurological: Negative for headaches, focal weakness or numbness.  10-point ROS otherwise negative.  ____________________________________________   PHYSICAL EXAM:  VITAL SIGNS: ED Triage Vitals  Enc Vitals Group     BP --  Pulse --      Resp --      Temp --      Temp src --      SpO2 --      Weight --      Height --      Head Cir --      Peak Flow --      Pain Score --      Pain Loc --      Pain Edu? --      Excl. in [place]? --    Filed Vitals:   01/17/15 1102  BP: 123/73  Pulse: 66  Temp: 97.7 F (36.5 C)  Resp: 18     Constitutional: Alert and oriented. Well appearing and in no acute distress. Eyes: Conjunctivae are normal. Head: Atraumatic. Nose: No congestion/rhinnorhea. Mouth/Throat: Mucous membranes are moist.   Neck: No stridor.  No cervical spine tenderness to palpation Hematological/Lymphatic/Immunilogical: No cervical lymphadenopathy. Cardiovascular: Normal rate, regular rhythm. Grossly normal heart sounds.  Good peripheral circulation. Respiratory: Normal respiratory effort.  No retractions. Lungs CTAB. Gastrointestinal: Soft and nontender. No  distention. No abdominal bruits. No CVA tenderness Neurologic:  Normal speech and language. No gross focal neurologic deficits are appreciated. Speech is normal. No gait instability. Skin:  Skin is warm, dry and intact. No rash noted. Right hand no erythema. Skin intact. Full ROM. Grips equal.  Psychiatric: Mood and affect are normal. Speech and behavior are normal.  ____________________________________________  ___________________________________________   INITIAL IMPRESSION / ASSESSMENT AND PLAN / ED COURSE  Pertinent labs & imaging results that were available during my care of the patient were reviewed by me and considered in my medical decision making (see chart for details).  Very well-appearing patient. Presents today for rabies immunization. This is patient's fourth of 4 rabies immunizations post stray cat bite. Patient to follow up with primary care physician as needed. Discussed follow-up and return parameters. ____________________________________________   FINAL CLINICAL IMPRESSION(S) / ED DIAGNOSES  Final diagnoses:  Need for immunization against rabies  Cat bite, subsequent encounter      Marylene Land, NP 01/17/15 [place], MD 01/19/15 [phone]

## 2015-04-01 ENCOUNTER — Encounter: Payer: Self-pay | Admitting: Family Medicine

## 2015-07-29 ENCOUNTER — Ambulatory Visit: Payer: Medicare Other

## 2015-07-29 ENCOUNTER — Ambulatory Visit
Admission: EM | Admit: 2015-07-29 | Discharge: 2015-07-29 | Disposition: A | Discharge status: Home / Self Care | Payer: Medicare Other | Attending: Family Medicine | Admitting: Family Medicine

## 2015-07-29 DIAGNOSIS — S52531A Colles' fracture of right radius, initial encounter for closed fracture: Secondary | ICD-10-CM | POA: Insufficient documentation

## 2015-07-29 DIAGNOSIS — W1839XA Other fall on same level, initial encounter: Secondary | ICD-10-CM | POA: Insufficient documentation

## 2015-07-29 DIAGNOSIS — S52601A Unspecified fracture of lower end of right ulna, initial encounter for closed fracture: Secondary | ICD-10-CM

## 2015-07-29 DIAGNOSIS — S52501A Unspecified fracture of the lower end of right radius, initial encounter for closed fracture: Secondary | ICD-10-CM

## 2015-07-29 IMAGING — CR DG WRIST COMPLETE 3+V*R*
4 series · 4 of 4 positions shown · non-contrast
Comparison: None.

CLINICAL DATA: Fall 2 days ago

EXAM:
RIGHT WRIST - COMPLETE 3+ VIEW

[wrist pa (1 of 2)]
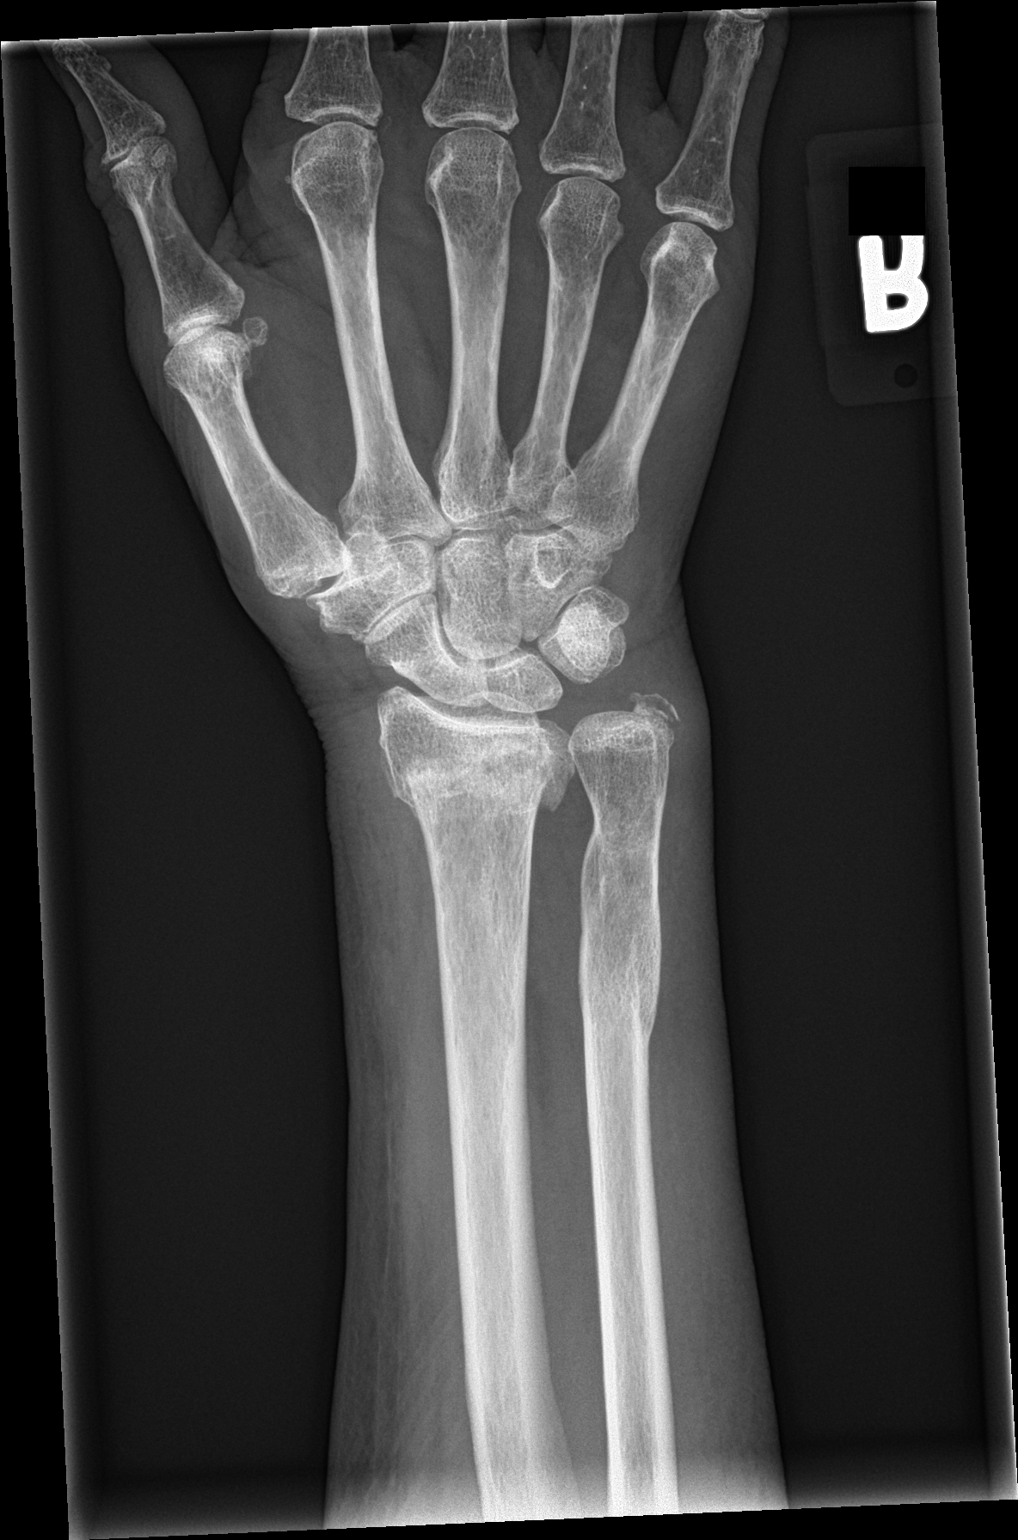

[wrist obl]
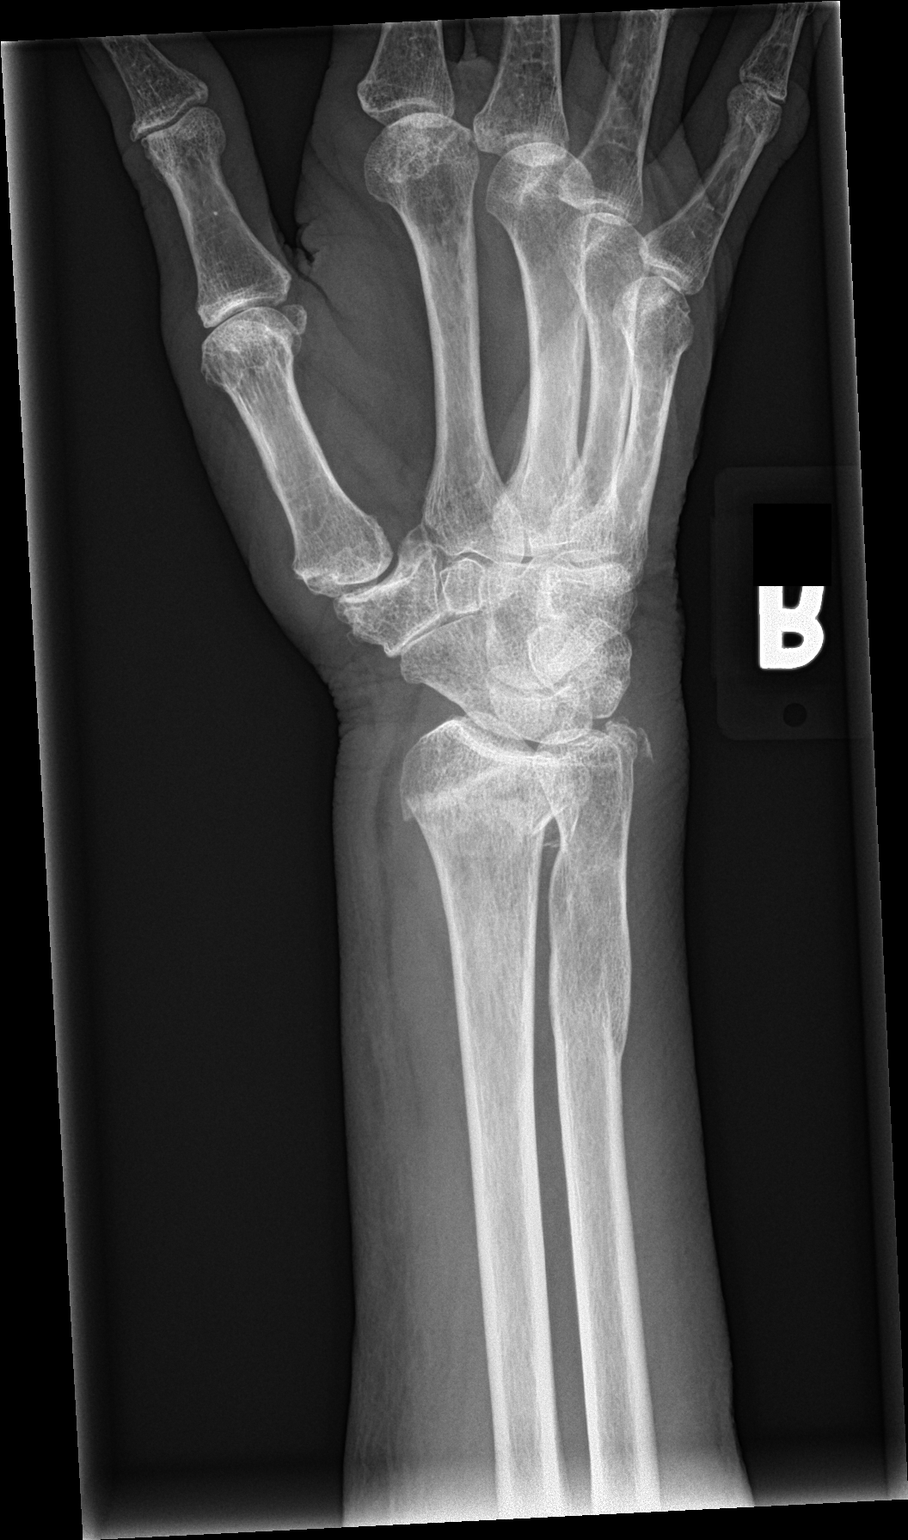

[wrist lat]
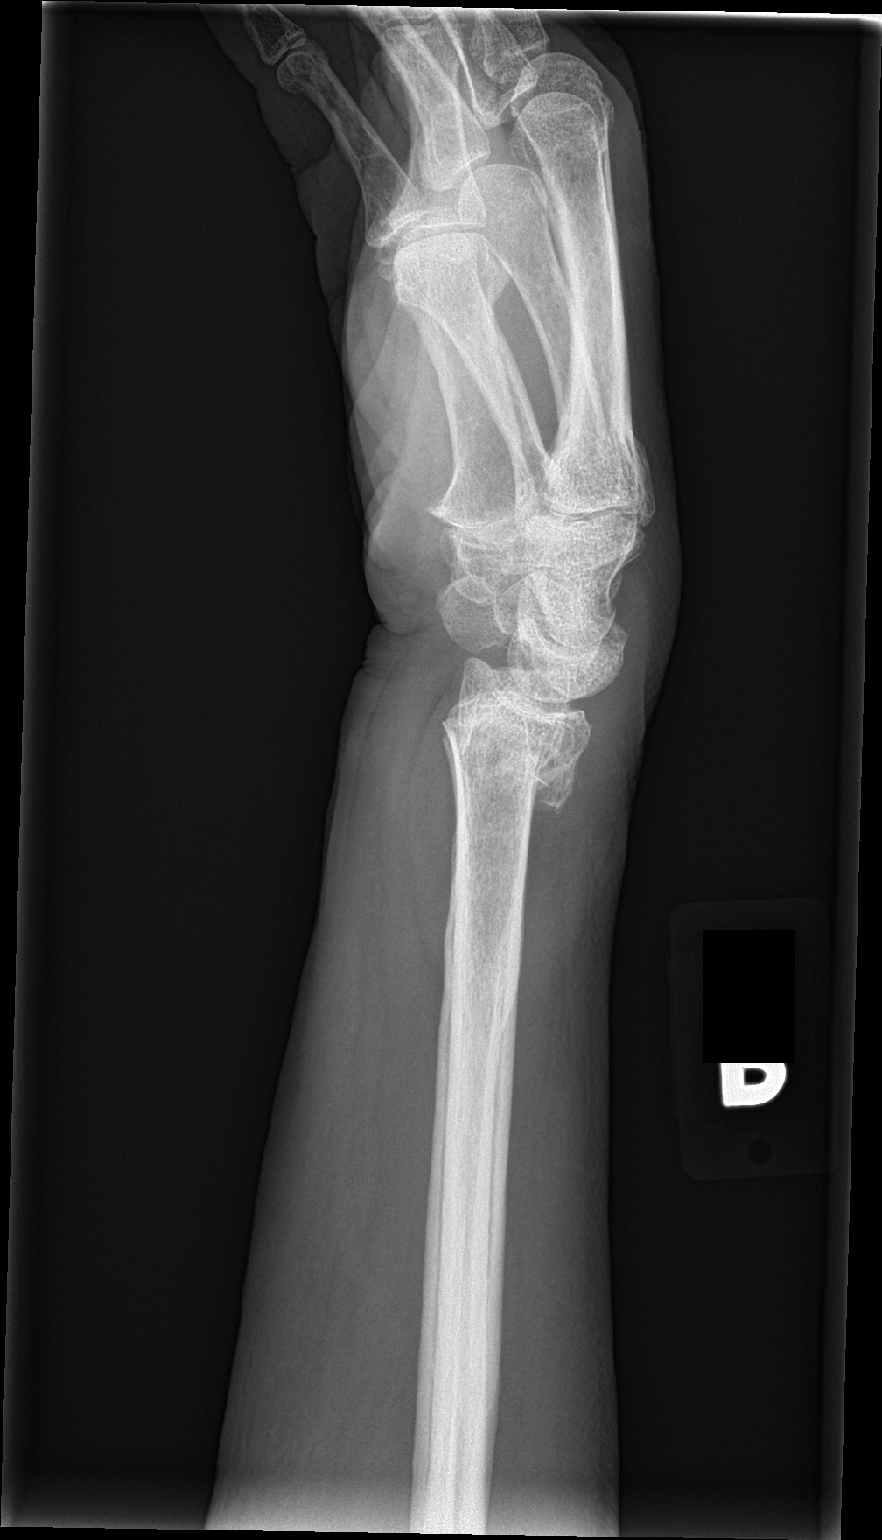

[wrist pa (2 of 2)]
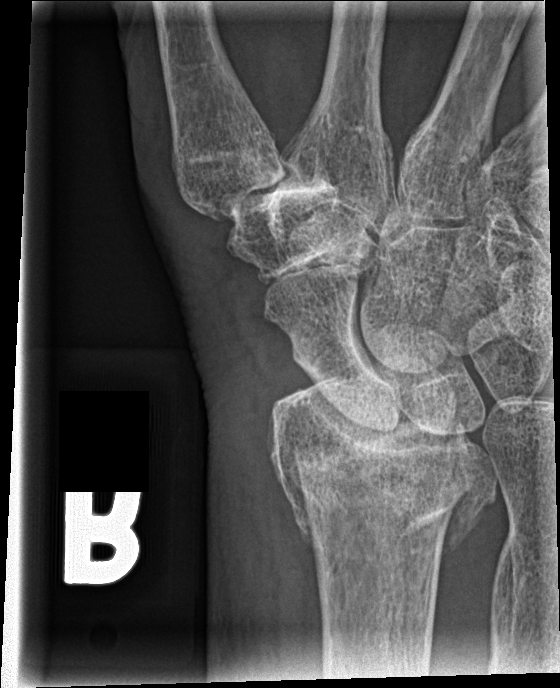

[4 of 4 positions shown; findings below may reference images not displayed]

FINDINGS: Comminuted transverse fracture distal radius. Mild dorsal
displacement and angulation of the radial fracture. There is a
fracture of the ulnar styloid.

Radiocarpal joint intact. Mild degenerative change at the base of
the thumb. Chronic fracture deformity of the distal ulna.
IMPRESSION: Colles fracture with mild dorsal displacement and angulation.

Chronic fracture deformity distal ulna.

## 2015-07-29 MED ORDER — TRAMADOL HCL 50 MG PO TABS: 50.0000 mg | ORAL_TABLET | Freq: Three times a day (TID) | ORAL | Status: DC | PRN

## 2015-07-29 NOTE — Discharge Instructions (Signed)
Keep in splint. Apply ice. Elevate. Take medication as needed.   Follow up with orthopedic as scheduled.  You have an appointment Wednesday at 1:30 pm with Dr Rudene Christians.   Follow up with your primary care physician this week as needed. Return to Urgent care as needed for new or worsening concerns.

## 2015-07-29 NOTE — ED Provider Notes (Signed)
[facility]  ____________________________________________  Time seen: Approximately 3:07 PM  I have reviewed the triage vital signs and the nursing notes.   HISTORY  Chief Complaint Wrist Pain   HPI Susan Moss is a 81 y.o. female presents for the complaints of right wrist pain. Patient reports that this past Saturday she was at her granddaughter's house letting her granddaughter's dogs outside and states she was walking on their front walkway and accidentally stepped off onto another block which then caused her to lose her balance and fall backwards. Patient states that she attempted to catch herself with her right wrist. Denies head injury or loss of consciousness. Denies neck or back pain or injury. Denies other extremity injury. Patient reports she was able to get herself up. Reports has continued to remain active over last several days.  Patient reports that this fall was witnessed by her grandson.Denies other recent falls. States drove self to urgent care   Patient reports that she has had continued right wrist pain since fall. States that she is taking over-the-counter Tylenol and ibuprofen which has helped some. Patient states that she find presenting today as her son-in-law encouraged her to be seen.   Past Medical History  Diagnosis Date  . Pure hypercholesterolemia   . Osteopenia   . Bruit of left carotid artery     carotid doppler 04/2011 mild plaque formation only  . Unspecified essential hypertension     There are no active problems to display for this patient.   Past Surgical History  Procedure Laterality Date  . Tonsillectomy  1943  . Cyst removed  1970    Current Outpatient Rx  Name  Route  Sig  Dispense  Refill  . aspirin EC 81 MG tablet   Oral   Take 81 mg by mouth daily.         . calcium-vitamin D (OSCAL) 250-125 MG-UNIT per tablet   Oral   Take 1 tablet by mouth daily.         . cholecalciferol (VITAMIN D) 1000 UNITS tablet    Oral   Take 1,000 Units by mouth daily.         . cyanocobalamin 100 MCG tablet   Oral   Take 100 mcg by mouth daily.         Marland Kitchen lisinopril (PRINIVIL,ZESTRIL) 5 MG tablet   Oral   Take 5 mg by mouth daily.         . Multiple Vitamins-Minerals (MULTI FOR HER PO)   Oral   Take by mouth daily.         . Omega-3 Fatty Acids (FISH OIL PO)   Oral   Take by mouth.         . simvastatin (ZOCOR) 20 MG tablet   Oral   Take 20 mg by mouth every evening.           Allergies Review of patient's allergies indicates no known allergies.  Family History  Problem Relation Age of Onset  . Heart disease Mother   . Heart disease Father   . Emphysema Father   . Heart disease Sister   . Diabetes Sister   . Heart disease Sister     heart attack  . Transient ischemic attack Sister   . Stroke Brother   . Emphysema Brother     Social History Social History  Substance Use Topics  . Smoking status: Never Smoker   . Smokeless tobacco: None  . Alcohol Use: No  Review of Systems Constitutional: No fever/chills Eyes: No visual changes. ENT: No sore throat. Cardiovascular: Denies chest pain. Respiratory: Denies shortness of breath. Gastrointestinal: No abdominal pain.  No nausea, no vomiting.  No diarrhea.  No constipation. Genitourinary: Negative for dysuria. Musculoskeletal: Negative for back pain. Right wrist pain.  Skin: Negative for rash. Neurological: Negative for headaches, focal weakness or numbness.  10-point ROS otherwise negative.  ____________________________________________   PHYSICAL EXAM:  VITAL SIGNS: ED Triage Vitals  Enc Vitals Group     BP 07/29/15 1429 128/85 mmHg     Pulse Rate 07/29/15 1429 113 Recheck 84     Resp 07/29/15 1429 17     Temp 07/29/15 1429 97.6 F (36.4 C)     Temp Source 07/29/15 1429 Tympanic     SpO2 07/29/15 1429 100 %     Weight 07/29/15 1429 118 lb (53.524 kg)     Height 07/29/15 1429 5\' 1"  (1.549 m)     Head Cir  --      Peak Flow --      Pain Score 07/29/15 1432 10     Pain Loc --      Pain Edu? --      Excl. in [place]? --     Constitutional: Alert and oriented. Well appearing and in no acute distress. Eyes: Conjunctivae are normal. PERRL. EOMI. Head: Atraumatic.  Nose: No congestion/rhinnorhea.  Mouth/Throat: Mucous membranes are moist.   Neck: No stridor.  No cervical spine tenderness to palpation. Hematological/Lymphatic/Immunilogical: No cervical lymphadenopathy. Cardiovascular: Normal rate, regular rhythm. Grossly normal heart sounds.  Good peripheral circulation. Respiratory: Normal respiratory effort.  No retractions. Lungs CTAB. Gastrointestinal: Soft and nontender Musculoskeletal: No lower or upper extremity tenderness nor edema.  No joint effusions. Bilateral pedal pulses equal and easily palpated. No cervical, thoracic, or lumbar TTP. Ambulatory with steady gait.  Except:  Right distal radial and ulnar wrist with mild to mod swelling with positive deformity and mild to mod TTP, limited flexion and extension at wrist, otherwise no pain and full ROM to right upper extremity. Distal radial pulses equal and easily palpated. Sensation intact to bilateral upper extremities.  Neurologic:  Normal speech and language. No gross focal neurologic deficits are appreciated. No gait instability. Skin:  Skin is warm, dry and intact. No rash noted. Psychiatric: Mood and affect are normal. Speech and behavior are normal.  ____________________________________________   LABS (all labs ordered are listed, but only abnormal results are displayed)  Labs Reviewed - No data to display  RADIOLOGY  EXAM: RIGHT WRIST - COMPLETE 3+ VIEW  COMPARISON: None.  FINDINGS: Comminuted transverse fracture distal radius. Mild dorsal displacement and angulation of the radial fracture. There is a fracture of the ulnar styloid.  Radiocarpal joint intact. Mild degenerative change at the base of the thumb. Chronic  fracture deformity of the distal ulna.  IMPRESSION: Colles fracture with mild dorsal displacement and angulation.  Chronic fracture deformity distal ulna.   Electronically Signed By: Franchot Gallo M.D. On: 07/29/2015 15:01      I, Marylene Land, personally viewed and evaluated these images (plain radiographs) as part of my medical decision making.    PROCEDURES  Procedure(s) performed: Volar Dorsal OCL splint applied by RN. Sling given. Neurovascular intact post application.   ____________________________________________   INITIAL IMPRESSION / ASSESSMENT AND PLAN / ED COURSE  Pertinent labs & imaging results that were available during my care of the patient were reviewed by me and considered in my medical decision making (  see chart for details). Very well-appearing patient. No acute chest. Presents for complaints of right distal wrist pain post mechanical fall 2 days ago. Denies head injury or loss consciousness. Denies other pain or injury. Patient is has remained ambulatory as well as active. Patient drove self to urgent care today. Right wrist x-ray Colles fracture with mild dorsal displacement and angulation. Will discuss with orthopedic.  1510: paged ortho, awaiting ortho call.  1605: Discussed patient and plan of care with Dr Rudene Christians, orthopedic, who also reviewed xray. Recommends volar dorsal splint and sling. Ice and elevation. Dr Rudene Christians will see patient in office this Wed at 1:30 pm.   Discussed follow up with Primary care physician this week. Discussed follow up and return parameters including no resolution or any worsening concerns. Patient verbalized understanding and agreed to plan.   ____________________________________________   FINAL CLINICAL IMPRESSION(S) / ED DIAGNOSES  Final diagnoses:  Distal radius fracture, right, closed, initial encounter  Right distal ulnar fracture, closed, initial encounter       Marylene Land, NP 07/29/15 1635

## 2015-07-29 NOTE — ED Notes (Signed)
Wrist splint made with [employer] as instructed by L. Sabra Heck NP

## 2015-07-29 NOTE — ED Notes (Signed)
States tripped and fell backwards Saturday night landing on right wrist. + bruising and swelling. + pulse. Very limited ROM

## 2017-03-31 ENCOUNTER — Encounter: Payer: Self-pay | Admitting: Family Medicine

## 2017-03-31 ENCOUNTER — Ambulatory Visit (INDEPENDENT_AMBULATORY_CARE_PROVIDER_SITE_OTHER): Payer: Medicare Other | Admitting: Family Medicine

## 2017-03-31 VITALS — BP 170/80 | HR 77 | Temp 98.0°F | Resp 18 | Ht 59.84 in | Wt 128.0 lb

## 2017-03-31 DIAGNOSIS — I10 Essential (primary) hypertension: Secondary | ICD-10-CM | POA: Diagnosis not present

## 2017-03-31 DIAGNOSIS — E2839 Other primary ovarian failure: Secondary | ICD-10-CM | POA: Diagnosis not present

## 2017-03-31 DIAGNOSIS — Z23 Encounter for immunization: Secondary | ICD-10-CM

## 2017-03-31 DIAGNOSIS — Z0000 Encounter for general adult medical examination without abnormal findings: Secondary | ICD-10-CM

## 2017-03-31 DIAGNOSIS — E7800 Pure hypercholesterolemia, unspecified: Secondary | ICD-10-CM | POA: Diagnosis not present

## 2017-03-31 LAB — POCT URINALYSIS DIP (MANUAL ENTRY)
BILIRUBIN UA: NEGATIVE mg/dL
Bilirubin, UA: NEGATIVE
Blood, UA: NEGATIVE
GLUCOSE UA: NEGATIVE mg/dL
Leukocytes, UA: NEGATIVE
Nitrite, UA: NEGATIVE
PROTEIN UA: NEGATIVE mg/dL
SPEC GRAV UA: 1.01 (ref 1.010–1.025)
Urobilinogen, UA: 0.2 E.U./dL
pH, UA: 7 (ref 5.0–8.0)

## 2017-03-31 MED ORDER — LISINOPRIL 5 MG PO TABS: 5.0000 mg | ORAL_TABLET | Freq: Every day | ORAL | 1 refills | Status: DC

## 2017-03-31 NOTE — Progress Notes (Signed)
Subjective:    Patient ID: Susan Moss, female    DOB: March 13, 1934, 83 y.o.   MRN: [id number]  03/31/2017  Annual Exam; Hypertension; and Hyperlipidemia   HPI This 83 y.o. female presents with daughter to establish care  and for annual wellness examination and for follow-up of hypertension and hypercholesterolemia. Not checking blood pressure at home.  Eating well; exercising well.    HTN: stopped taking medication after last visit with provider five years ago; not checking BP regularly.  Hypercholesterolemia: not checking cholesterol; has not seen provider in five years.   BP Readings from Last 3 Encounters:  03/31/17 (!) 170/80  07/29/15 (!) 157/82  01/17/15 123/73   Wt Readings from Last 3 Encounters:  03/31/17 128 lb (58.1 kg)  07/29/15 118 lb (53.5 kg)  01/10/15 120 lb (54.4 kg)   Immunization History  Administered Date(s) Administered  . Influenza-Unspecified 05/06/2011, 05/14/2014  . Pneumococcal Conjugate-13 03/31/2017  . Pneumococcal-Unspecified 05/27/2010  . Rabies, IM 01/03/2015, 01/05/2015, 01/10/2015, 01/17/2015  . Tdap 05/06/2011, 01/03/2015    Review of Systems  Constitutional: Negative for activity change, appetite change, chills, diaphoresis, fatigue, fever and unexpected weight change.  HENT: Negative for congestion, dental problem, drooling, ear discharge, ear pain, facial swelling, hearing loss, mouth sores, nosebleeds, postnasal drip, rhinorrhea, sinus pressure, sneezing, sore throat, tinnitus, trouble swallowing and voice change.   Eyes: Negative for photophobia, pain, discharge, redness, itching and visual disturbance.  Respiratory: Negative for apnea, cough, choking, chest tightness, shortness of breath, wheezing and stridor.   Cardiovascular: Negative for chest pain, palpitations and leg swelling.  Gastrointestinal: Negative for abdominal distention, abdominal pain, anal bleeding, blood in stool, constipation, diarrhea, nausea, rectal pain and  vomiting.  Endocrine: Negative for cold intolerance, heat intolerance, polydipsia, polyphagia and polyuria.  Genitourinary: Negative for decreased urine volume, difficulty urinating, dyspareunia, dysuria, enuresis, flank pain, frequency, genital sores, hematuria, menstrual problem, pelvic pain, urgency, vaginal bleeding, vaginal discharge and vaginal pain.  Musculoskeletal: Negative for arthralgias, back pain, gait problem, joint swelling, myalgias, neck pain and neck stiffness.  Skin: Negative for color change, pallor, rash and wound.  Allergic/Immunologic: Negative for environmental allergies, food allergies and immunocompromised state.  Neurological: Negative for dizziness, tremors, seizures, syncope, facial asymmetry, speech difficulty, weakness, light-headedness, numbness and headaches.  Hematological: Negative for adenopathy. Does not bruise/bleed easily.  Psychiatric/Behavioral: Negative for agitation, behavioral problems, confusion, decreased concentration, dysphoric mood, hallucinations, self-injury, sleep disturbance and suicidal ideas. The patient is not nervous/anxious and is not hyperactive.     Past Medical History:  Diagnosis Date  . Bruit of left carotid artery    carotid doppler 04/2011 mild plaque formation only  . Osteopenia   . Pure hypercholesterolemia   . Unspecified essential hypertension    Past Surgical History:  Procedure Laterality Date  . cyst removed  1970  . TONSILLECTOMY  1943   No Known Allergies  Social History   Social History  . Marital status: Widowed    Spouse name: N/A  . Number of children: 3  . Years of education: N/A   Occupational History  . retired     from [employer] 12/10/10   Social History Main Topics  . Smoking status: Never Smoker  . Smokeless tobacco: Never Used  . Alcohol use No  . Drug use: No  . Sexual activity: Not Currently   Other Topics Concern  . Not on file   Social History Narrative   Always uses seat belts.  Smoke alarm in the home.No guns in  the home.Caffeine use: Coffee 2 servings a day. Exercise: Moderate, 3 - 4 miles when weather pretty. Has joined the [facility] and will go there regularly.Widowed x 20 years married 21 years. 4 grandchildren , 2 step grandchildren 4 gg. Lives alone and performs all ADLs, drives. Pt does Not have living will but DOES have HCPOA. Organ donor: NO.      Marital status: widowed; not dating.      Children:  3 children; 4 grandchildren; 1 gg      [place]ives[place] [facility]      Tobacco: none      Alcohol: none      Exercise: walking a lot; walking at [facility] every day.       Advanced Directives:  None; no HCPOA; FULL CODE; no prolonged measures    Family History  Problem Relation Age of Onset  . Heart disease Mother   . Heart disease Father   . Emphysema Father   . Heart disease Sister   . Diabetes Sister   . Heart disease Sister        heart attack  . Transient ischemic attack Sister   . Stroke Brother   . Emphysema Brother        Objective:    BP (!) 170/80   Pulse 77   Temp 98 F (36.7 C) (Oral)   Resp 18   Ht 4' 11.84" (1.52 m)   Wt 128 lb (58.1 kg)   SpO2 98%   BMI 25.13 kg/m  Physical Exam  Constitutional: She is oriented to person, place, and time. She appears well-developed and well-nourished. No distress.  HENT:  Head: Normocephalic and atraumatic.  Right Ear: External ear normal.  Left Ear: External ear normal.  Nose: Nose normal.  Mouth/Throat: Oropharynx is clear and moist.  Eyes: Pupils are equal, round, and reactive to light. Conjunctivae and EOM are normal.  Neck: Normal range of motion and full passive range of motion without pain. Neck supple. No JVD present. Carotid bruit is not present. No thyromegaly present.  Cardiovascular: Normal rate, regular rhythm, normal heart sounds and intact distal pulses.  Exam reveals no gallop and no friction rub.   No murmur heard. Pulmonary/Chest: Effort normal and breath  sounds normal. She has no wheezes. She has no rales.  Abdominal: Soft. Bowel sounds are normal. She exhibits no distension and no mass. There is no tenderness. There is no rebound and no guarding.  Musculoskeletal:       Right shoulder: Normal.       Left shoulder: Normal.       Cervical back: Normal.  Lymphadenopathy:    She has no cervical adenopathy.  Neurological: She is alert and oriented to person, place, and time. She has normal reflexes. No cranial nerve deficit. She exhibits normal muscle tone. Coordination normal.  Skin: Skin is warm and dry. No rash noted. She is not diaphoretic. No erythema. No pallor.  Psychiatric: She has a normal mood and affect. Her behavior is normal. Judgment and thought content normal.  Nursing note and vitals reviewed.  Depression screen PHQ 2/9 03/31/2017  Decreased Interest 0  Down, Depressed, Hopeless 0  PHQ - 2 Score 0   Fall Risk  03/31/2017  Falls in the past year? No   Functional Status Survey: Is the patient deaf or have difficulty hearing?: No Does the patient have difficulty seeing, even when wearing glasses/contacts?: No Does the patient have difficulty concentrating, remembering, or making decisions?:  No Does the patient have difficulty walking or climbing stairs?: No Does the patient have difficulty dressing or bathing?: No Does the patient have difficulty doing errands alone such as visiting a doctor's office or shopping?: No     Assessment & Plan:   1. Medicare annual wellness visit, subsequent   2. Essential hypertension   3. Pure hypercholesterolemia   4. Need for prophylactic vaccination against Streptococcus pneumoniae (pneumococcus)   5. Estrogen deficiency     -anticipatory guidance provided --- exercise, weight loss, safe driving practices, aspirin 81mg  daily. -obtain age appropriate screening labs and labs for chronic disease management. -moderate fall risk due to age; no evidence of depression; no evidence of hearing  loss.  Discussed advanced directives and living will; also discussed end of life issues including code status.  -restart Lisinopril 5mg  daily. -obtain FLP; will determine need for statin once labs return.   Orders Placed This Encounter  Procedures  . Pneumococcal conjugate vaccine 13-valent IM  . CBC with Differential/Platelet  . Comprehensive metabolic panel    Order Specific Question:   Has the patient fasted?    Answer:   Yes  . Lipid panel    Order Specific Question:   Has the patient fasted?    Answer:   Yes  . CBC with Differential/Platelet  . POCT urinalysis dipstick  . EKG 12-Lead   Meds ordered this encounter  Medications  . lisinopril (PRINIVIL,ZESTRIL) 5 MG tablet    Sig: Take 1 tablet (5 mg total) by mouth daily.    Dispense:  90 tablet    Refill:  1    Return in about 8 weeks (around 05/26/2017) for recheck high blood pressure.   Kristi Elayne Guerin, M.D. Primary Care at [facility] [place] [address] [place], [place]  [address] [phone] phone [phone] fax

## 2017-03-31 NOTE — Patient Instructions (Addendum)
   IF you received an x-ray today, you will receive an invoice from [facility]. Please contact [facility] at [phone] with questions or concerns regarding your invoice.   IF you received labwork today, you will receive an invoice from [facility]. Please contact [facility] at [phone] with questions or concerns regarding your invoice.   Our billing staff will not be able to assist you with questions regarding bills from these companies.  You will be contacted with the lab results as soon as they are available. The fastest way to get your results is to activate your My Chart account. Instructions are located on the last page of this paperwork. If you have not heard from us regarding the results in 2 weeks, please contact this office.      Preventive Care 81 Years and Older, Female Preventive care refers to lifestyle choices and visits with your health care provider that can promote health and wellness. What does preventive care include?  A yearly physical exam. This is also called an annual well check.  Dental exams once or twice a year.  Routine eye exams. Ask your health care provider how often you should have your eyes checked.  Personal lifestyle choices, including: ? Daily care of your teeth and gums. ? Regular physical activity. ? Eating a healthy diet. ? Avoiding tobacco and drug use. ? Limiting alcohol use. ? Practicing safe sex. ? Taking low-dose aspirin every day. ? Taking vitamin and mineral supplements as recommended by your health care provider. What happens during an annual well check? The services and screenings done by your health care provider during your annual well check will depend on your age, overall health, lifestyle risk factors, and family history of disease. Counseling Your health care provider may ask you questions about your:  Alcohol use.  Tobacco use.  Drug use.  Emotional well-being.  Home and relationship  well-being.  Sexual activity.  Eating habits.  History of falls.  Memory and ability to understand (cognition).  Work and work environment.  Reproductive health.  Screening You may have the following tests or measurements:  Height, weight, and BMI.  Blood pressure.  Lipid and cholesterol levels. These may be checked every 5 years, or more frequently if you are over 50 years old.  Skin check.  Lung cancer screening. You may have this screening every year starting at age 55 if you have a 30-pack-year history of smoking and currently smoke or have quit within the past 15 years.  Fecal occult blood test (FOBT) of the stool. You may have this test every year starting at age 50.  Flexible sigmoidoscopy or colonoscopy. You may have a sigmoidoscopy every 5 years or a colonoscopy every 10 years starting at age 50.  Hepatitis C blood test.  Hepatitis B blood test.  Sexually transmitted disease (STD) testing.  Diabetes screening. This is done by checking your blood sugar (glucose) after you have not eaten for a while (fasting). You may have this done every 1-3 years.  Bone density scan. This is done to screen for osteoporosis. You may have this done starting at age 65.  Mammogram. This may be done every 1-2 years. Talk to your health care provider about how often you should have regular mammograms.  Talk with your health care provider about your test results, treatment options, and if necessary, the need for more tests. Vaccines Your health care provider may recommend certain vaccines, such as:  Influenza vaccine. This is recommended every year.    Tetanus, diphtheria, and acellular pertussis (Tdap, Td) vaccine. You may need a Td booster every 10 years.  Varicella vaccine. You may need this if you have not been vaccinated.  Zoster vaccine. You may need this after age 60.  Measles, mumps, and rubella (MMR) vaccine. You may need at least one dose of MMR if you were born in  1957 or later. You may also need a second dose.  Pneumococcal 13-valent conjugate (PCV13) vaccine. One dose is recommended after age 65.  Pneumococcal polysaccharide (PPSV23) vaccine. One dose is recommended after age 65.  Meningococcal vaccine. You may need this if you have certain conditions.  Hepatitis A vaccine. You may need this if you have certain conditions or if you travel or work in places where you may be exposed to hepatitis A.  Hepatitis B vaccine. You may need this if you have certain conditions or if you travel or work in places where you may be exposed to hepatitis B.  Haemophilus influenzae type b (Hib) vaccine. You may need this if you have certain conditions.  Talk to your health care provider about which screenings and vaccines you need and how often you need them. This information is not intended to replace advice given to you by your health care provider. Make sure you discuss any questions you have with your health care provider. Document Released: 09/06/2015 Document Revised: 04/29/2016 Document Reviewed: 06/11/2015 Elsevier Interactive Patient Education  2017 Elsevier Inc.  

## 2017-04-01 LAB — CBC WITH DIFFERENTIAL/PLATELET
BASOS ABS: 0 10*3/uL (ref 0.0–0.2)
Basos: 1 %
EOS (ABSOLUTE): 0.1 10*3/uL (ref 0.0–0.4)
Eos: 1 %
Hematocrit: 43.3 % (ref 34.0–46.6)
Hemoglobin: 13.9 g/dL (ref 11.1–15.9)
Immature Grans (Abs): 0 10*3/uL (ref 0.0–0.1)
Immature Granulocytes: 0 %
LYMPHS ABS: 2.2 10*3/uL (ref 0.7–3.1)
Lymphs: 32 %
MCH: 28.9 pg (ref 26.6–33.0)
MCHC: 32.1 g/dL (ref 31.5–35.7)
MCV: 90 fL (ref 79–97)
MONOS ABS: 0.5 10*3/uL (ref 0.1–0.9)
Monocytes: 7 %
NEUTROS ABS: 4.1 10*3/uL (ref 1.4–7.0)
Neutrophils: 59 %
Platelets: 313 10*3/uL (ref 150–379)
RBC: 4.81 x10E6/uL (ref 3.77–5.28)
RDW: 13.9 % (ref 12.3–15.4)
WBC: 6.9 10*3/uL (ref 3.4–10.8)

## 2017-04-01 LAB — COMPREHENSIVE METABOLIC PANEL
ALT: 15 IU/L (ref 0–32)
AST: 22 IU/L (ref 0–40)
Albumin/Globulin Ratio: 2 (ref 1.2–2.2)
Albumin: 4.5 g/dL (ref 3.5–4.7)
Alkaline Phosphatase: 71 IU/L (ref 39–117)
BUN/Creatinine Ratio: 12 (ref 12–28)
BUN: 10 mg/dL (ref 8–27)
Bilirubin Total: 0.5 mg/dL (ref 0.0–1.2)
CO2: 26 mmol/L (ref 20–29)
CREATININE: 0.84 mg/dL (ref 0.57–1.00)
Calcium: 9.6 mg/dL (ref 8.7–10.3)
Chloride: 104 mmol/L (ref 96–106)
GFR calc Af Amer: 75 mL/min/{1.73_m2} (ref >59)
GFR calc non Af Amer: 65 mL/min/{1.73_m2} (ref >59)
GLOBULIN, TOTAL: 2.2 g/dL (ref 1.5–4.5)
Glucose: 77 mg/dL (ref 65–99)
Potassium: 4 mmol/L (ref 3.5–5.2)
SODIUM: 147 mmol/L — AB (ref 134–144)
Total Protein: 6.7 g/dL (ref 6.0–8.5)

## 2017-04-01 LAB — LIPID PANEL
CHOL/HDL RATIO: 4.5 ratio — AB (ref 0.0–4.4)
Cholesterol, Total: 289 mg/dL — ABNORMAL HIGH (ref 100–199)
HDL: 64 mg/dL (ref >39)
LDL Calculated: 198 mg/dL — ABNORMAL HIGH (ref 0–99)
TRIGLYCERIDES: 135 mg/dL (ref 0–149)
VLDL CHOLESTEROL CAL: 27 mg/dL (ref 5–40)

## 2017-04-21 ENCOUNTER — Encounter: Payer: Self-pay | Admitting: Family Medicine

## 2017-04-22 ENCOUNTER — Telehealth: Payer: Self-pay | Admitting: Family Medicine

## 2017-04-24 ENCOUNTER — Other Ambulatory Visit: Payer: Self-pay | Admitting: Family Medicine

## 2017-04-24 MED ORDER — ATORVASTATIN CALCIUM 10 MG PO TABS: 10.0000 mg | ORAL_TABLET | Freq: Every day | ORAL | 3 refills | Status: DC

## 2017-04-27 NOTE — Telephone Encounter (Signed)
Rx already refilled but can you please release labs ?

## 2017-04-30 NOTE — Telephone Encounter (Signed)
Atorvastatin sent to pharmacy. Labs released and letter to be mailed to patient. Left message on patient's voicemail with lab results and recommendations to start Atorvastatin that was sent to pharmacy.

## 2017-05-26 ENCOUNTER — Ambulatory Visit: Payer: Medicare Other | Admitting: Family Medicine

## 2017-06-07 ENCOUNTER — Ambulatory Visit (INDEPENDENT_AMBULATORY_CARE_PROVIDER_SITE_OTHER): Payer: Medicare Other | Admitting: Family Medicine

## 2017-06-07 ENCOUNTER — Encounter: Payer: Self-pay | Admitting: Family Medicine

## 2017-06-07 VITALS — BP 152/74 | HR 78 | Temp 98.0°F | Resp 16 | Ht 59.45 in | Wt 125.0 lb

## 2017-06-07 DIAGNOSIS — I10 Essential (primary) hypertension: Secondary | ICD-10-CM

## 2017-06-07 DIAGNOSIS — E7800 Pure hypercholesterolemia, unspecified: Secondary | ICD-10-CM | POA: Diagnosis not present

## 2017-06-07 NOTE — Progress Notes (Signed)
Subjective:    Patient ID: Susan Moss, female    DOB: 11/18/1933, 83 y.o.   MRN: [id number]  06/07/2017  Hypertension (8 week follow-up)    HPI This 83 y.o. female presents for evaluation of hypertension, hypercholesterolemia.  Management changes made at last visit included starting Lisinopril 5mg  daily and Atorvastatin 10mg  daily.  Good attitude.  Walking.  Blood pressure at home running "good".  No side effects.  No concerns.  BP Readings from Last 3 Encounters:  06/07/17 (!) 150/82  03/31/17 (!) 170/80  07/29/15 (!) 157/82   Wt Readings from Last 3 Encounters:  06/07/17 125 lb (56.7 kg)  03/31/17 128 lb (58.1 kg)  07/29/15 118 lb (53.5 kg)   Immunization History  Administered Date(s) Administered  . Influenza-Unspecified 05/06/2011, 05/14/2014  . Pneumococcal Conjugate-13 03/31/2017  . Pneumococcal-Unspecified 05/27/2010  . Rabies, IM 01/03/2015, 01/05/2015, 01/10/2015, 01/17/2015  . Tdap 05/06/2011, 01/03/2015    Review of Systems  Constitutional: Negative for chills, diaphoresis, fatigue and fever.  Eyes: Negative for visual disturbance.  Respiratory: Negative for cough and shortness of breath.   Cardiovascular: Negative for chest pain, palpitations and leg swelling.  Gastrointestinal: Negative for abdominal pain, constipation, diarrhea, nausea and vomiting.  Endocrine: Negative for cold intolerance, heat intolerance, polydipsia, polyphagia and polyuria.  Neurological: Negative for dizziness, tremors, seizures, syncope, facial asymmetry, speech difficulty, weakness, light-headedness, numbness and headaches.    Past Medical History:  Diagnosis Date  . Bruit of left carotid artery    carotid doppler 04/2011 mild plaque formation only  . Osteopenia   . Pure hypercholesterolemia   . Unspecified essential hypertension    Past Surgical History:  Procedure Laterality Date  . cyst removed  1970  . TONSILLECTOMY  1943   No Known Allergies Current Outpatient  Prescriptions on File Prior to Visit  Medication Sig Dispense Refill  . aspirin EC 81 MG tablet Take 81 mg by mouth daily.    Marland Kitchen atorvastatin (LIPITOR) 10 MG tablet Take 1 tablet (10 mg total) by mouth daily. 90 tablet 3  . lisinopril (PRINIVIL,ZESTRIL) 5 MG tablet Take 1 tablet (5 mg total) by mouth daily. 90 tablet 1  . Omega-3 Fatty Acids (FISH OIL PO) Take by mouth.     No current facility-administered medications on file prior to visit.    Social History   Social History  . Marital status: Widowed    Spouse name: N/A  . Number of children: 3  . Years of education: N/A   Occupational History  . retired     from [employer] 12/10/10   Social History Main Topics  . Smoking status: Never Smoker  . Smokeless tobacco: Never Used  . Alcohol use No  . Drug use: No  . Sexual activity: Not Currently   Other Topics Concern  . Not on file   Social History Narrative   Always uses seat belts. Smoke alarm in the home.No guns in the home.Caffeine use: Coffee 2 servings a day. Exercise: Moderate, 3 - 4 miles when weather pretty. Has joined the [facility] and will go there regularly.Widowed x 20 years married 60 years. 4 grandchildren , 2 step grandchildren 4 gg. Lives alone and performs all ADLs, drives. Pt does Not have living will but DOES have HCPOA. Organ donor: NO.      Marital status: widowed; not dating.      Children:  3 children; 4 grandchildren; 1 gg      Lives[place] alone [facility]  Tobacco: none      Alcohol: none      Exercise: walking a lot; walking at [facility] every day.       Advanced Directives:  None; no HCPOA; FULL CODE; no prolonged measures    Family History  Problem Relation Age of Onset  . Heart disease Mother   . Heart disease Father   . Emphysema Father   . Heart disease Sister   . Diabetes Sister   . Heart disease Sister        heart attack  . Transient ischemic attack Sister   . Stroke Brother   . Emphysema Brother          Objective:    BP (!) 150/82   Pulse 78   Temp 98 F (36.7 C) (Oral)   Resp 16   Ht 4' 11.45" (1.51 m)   Wt 125 lb (56.7 kg)   SpO2 96%   BMI 24.87 kg/m  Physical Exam  Constitutional: She is oriented to person, place, and time. She appears well-developed and well-nourished. No distress.  HENT:  Head: Normocephalic and atraumatic.  Right Ear: External ear normal.  Left Ear: External ear normal.  Nose: Nose normal.  Mouth/Throat: Oropharynx is clear and moist.  Eyes: Pupils are equal, round, and reactive to light. Conjunctivae and EOM are normal.  Neck: Normal range of motion. Neck supple. Carotid bruit is not present. No thyromegaly present.  Cardiovascular: Normal rate, regular rhythm, normal heart sounds and intact distal pulses.  Exam reveals no gallop and no friction rub.   No murmur heard. Pulmonary/Chest: Effort normal and breath sounds normal. She has no wheezes. She has no rales.  Abdominal: Soft. Bowel sounds are normal. She exhibits no distension and no mass. There is no tenderness. There is no rebound and no guarding.  Lymphadenopathy:    She has no cervical adenopathy.  Neurological: She is alert and oriented to person, place, and time. No cranial nerve deficit.  Skin: Skin is warm and dry. No rash noted. She is not diaphoretic. No erythema. No pallor.  Psychiatric: She has a normal mood and affect. Her behavior is normal.   No results found. Depression screen [facility] 2/9 06/07/2017 03/31/2017  Decreased Interest 0 0  Down, Depressed, Hopeless 0 0  PHQ - 2 Score 0 0   Fall Risk  06/07/2017 03/31/2017  Falls in the past year? No No        Assessment & Plan:   1. Essential hypertension, benign   2. Pure hypercholesterolemia     -improved.  Obtain labs; continue current medications.   Orders Placed This Encounter  Procedures  . Comprehensive metabolic panel  . Lipid panel   No orders of the defined types were placed in this encounter.   No Follow-up on  file.   Elad Macphail Elayne Guerin, M.D. Primary Care at [facility] [address] [place], [place]  [address] [phone] phone [phone] fax

## 2017-06-07 NOTE — Patient Instructions (Addendum)
   PLEASE GET YOUR FLU SHOT AT YOUR PHARMACIST (I AM SO SORRY; WE RAN OUT THIS MORNING). CONTINUE YOUR MEDICATIONS AS PRESCRIBED.  IF you received an x-ray today, you will receive an invoice from [facility]. Please contact [facility] at [phone] with questions or concerns regarding your invoice.   IF you received labwork today, you will receive an invoice from [facility]. Please contact [facility] at [phone] with questions or concerns regarding your invoice.   Our billing staff will not be able to assist you with questions regarding bills from these companies.  You will be contacted with the lab results as soon as they are available. The fastest way to get your results is to activate your My Chart account. Instructions are located on the last page of this paperwork. If you have not heard from [place] regarding the results in 2 weeks, please contact this office.

## 2017-06-08 LAB — COMPREHENSIVE METABOLIC PANEL
ALK PHOS: 72 IU/L (ref 39–117)
ALT: 14 IU/L (ref 0–32)
AST: 23 IU/L (ref 0–40)
Albumin/Globulin Ratio: 2 (ref 1.2–2.2)
Albumin: 4.4 g/dL (ref 3.5–4.7)
BILIRUBIN TOTAL: 0.5 mg/dL (ref 0.0–1.2)
BUN/Creatinine Ratio: 19 (ref 12–28)
BUN: 14 mg/dL (ref 8–27)
CHLORIDE: 108 mmol/L — AB (ref 96–106)
CO2: 23 mmol/L (ref 20–29)
Calcium: 9.3 mg/dL (ref 8.7–10.3)
Creatinine, Ser: 0.75 mg/dL (ref 0.57–1.00)
GFR calc non Af Amer: 75 mL/min/{1.73_m2} (ref >59)
GFR, EST AFRICAN AMERICAN: 86 mL/min/{1.73_m2} (ref >59)
GLUCOSE: 90 mg/dL (ref 65–99)
Globulin, Total: 2.2 g/dL (ref 1.5–4.5)
Potassium: 4.3 mmol/L (ref 3.5–5.2)
Sodium: 144 mmol/L (ref 134–144)
TOTAL PROTEIN: 6.6 g/dL (ref 6.0–8.5)

## 2017-06-08 LAB — LIPID PANEL
CHOLESTEROL TOTAL: 172 mg/dL (ref 100–199)
Chol/HDL Ratio: 3 ratio (ref 0.0–4.4)
HDL: 58 mg/dL (ref >39)
LDL Calculated: 97 mg/dL (ref 0–99)
Triglycerides: 87 mg/dL (ref 0–149)
VLDL CHOLESTEROL CAL: 17 mg/dL (ref 5–40)

## 2017-06-15 ENCOUNTER — Encounter: Payer: Self-pay | Admitting: Family Medicine

## 2017-06-16 NOTE — Progress Notes (Signed)
Letter sent.

## 2017-09-11 ENCOUNTER — Other Ambulatory Visit: Payer: Self-pay | Admitting: Family Medicine

## 2017-09-11 DIAGNOSIS — I10 Essential (primary) hypertension: Secondary | ICD-10-CM

## 2018-01-19 ENCOUNTER — Encounter: Payer: Self-pay | Admitting: Family Medicine

## 2019-09-07 DIAGNOSIS — I10 Essential (primary) hypertension: Secondary | ICD-10-CM | POA: Diagnosis present

## 2019-09-08 ENCOUNTER — Other Ambulatory Visit: Payer: Self-pay | Admitting: Family Medicine

## 2019-09-08 DIAGNOSIS — R413 Other amnesia: Secondary | ICD-10-CM

## 2019-11-06 ENCOUNTER — Other Ambulatory Visit: Payer: Self-pay | Admitting: Neurology

## 2019-11-06 DIAGNOSIS — R4189 Other symptoms and signs involving cognitive functions and awareness: Secondary | ICD-10-CM

## 2019-11-16 DIAGNOSIS — F419 Anxiety disorder, unspecified: Secondary | ICD-10-CM | POA: Diagnosis present

## 2019-11-17 ENCOUNTER — Other Ambulatory Visit: Payer: Self-pay

## 2019-11-17 ENCOUNTER — Ambulatory Visit
Admission: RE | Admit: 2019-11-17 | Discharge: 2019-11-17 | Disposition: A | Discharge status: Home / Self Care | Payer: Medicare Other | Source: Ambulatory Visit | Attending: Neurology | Admitting: Neurology

## 2019-11-17 DIAGNOSIS — R4189 Other symptoms and signs involving cognitive functions and awareness: Secondary | ICD-10-CM

## 2019-11-17 IMAGING — MR MR HEAD W/O CM
12 series · 45 of 48 positions shown · non-contrast
Comparison: None.

CLINICAL DATA: Cognitive decline

EXAM:
MRI HEAD WITHOUT CONTRAST
TECHNIQUE: Multiplanar, multiecho pulse sequences of the brain and surrounding
structures were obtained without intravenous contrast.

[Series 5: ax dwi_tracew · axial · 3.0mm · 0.60mm/px · z∈[-117,+37]mm · 3 of 48 slices shown]
[im 1/48]
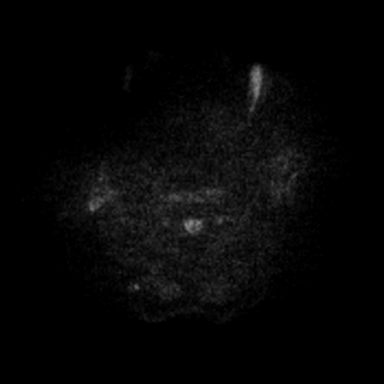
[im 24/48]
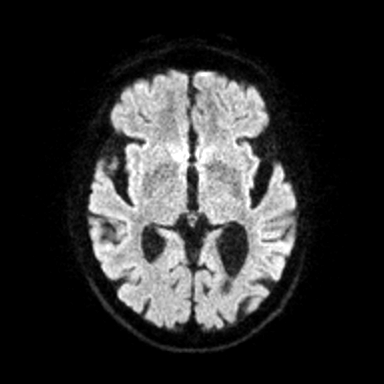
[im 48/48]
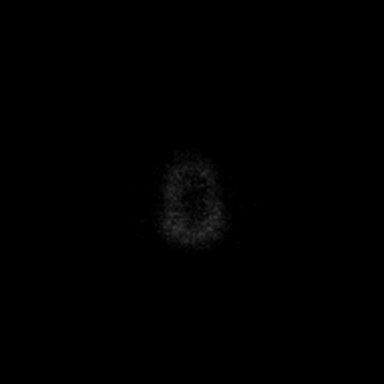

[Series 6: ax dwi_adc · axial · 3.0mm · 0.60mm/px · z∈[-117,+37]mm · 4 of 48 slices shown]
[im 1/48]
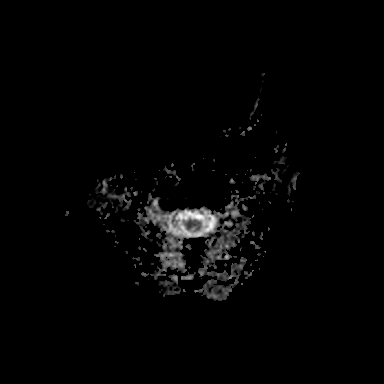
[im 16/48]
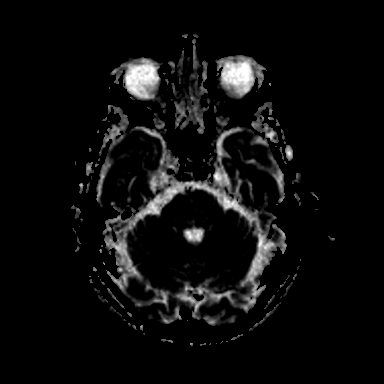
[im 32/48]
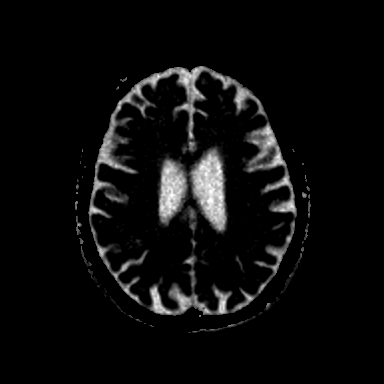
[im 48/48]
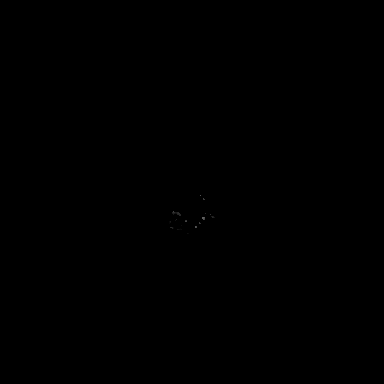

[Series 7: cor dwi_tracew · coronal · 5.0mm · 0.60mm/px · 3 of 38 slices shown]
[im 1/38]
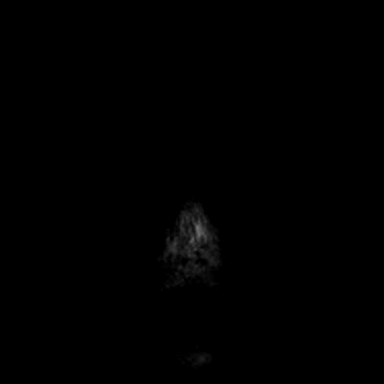
[im 19/38]
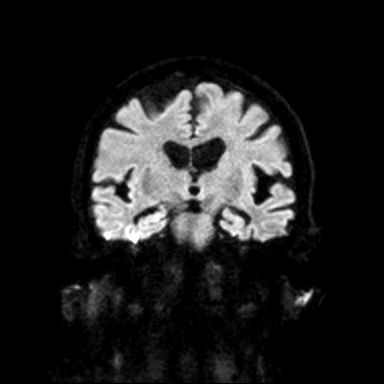
[im 38/38]
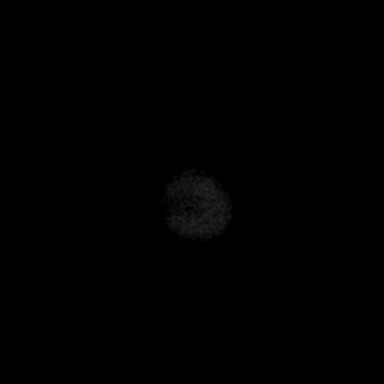

[Series 8: cor dwi_adc · coronal · 5.0mm · 0.60mm/px · 3 of 37 slices shown]
[im 1/37]
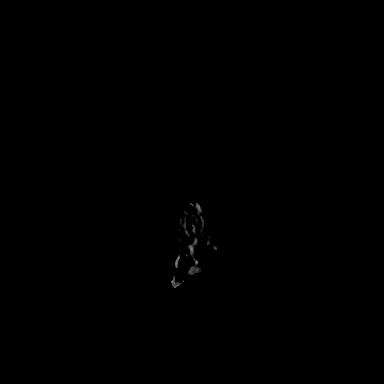
[im 19/37]
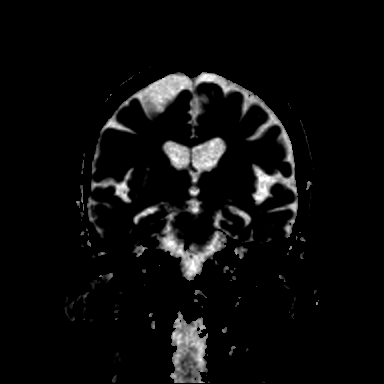
[im 37/37]
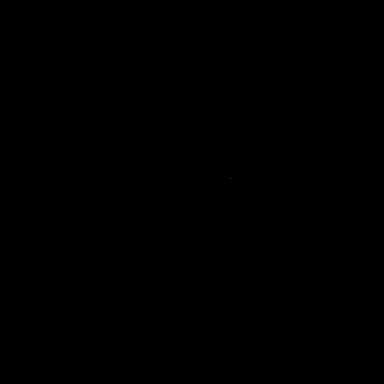

[Series 9: T1 · sagittal · 5.0mm · 0.62mm/px · 2 of 25 slices shown (1 of 2)]
[im 1/25]
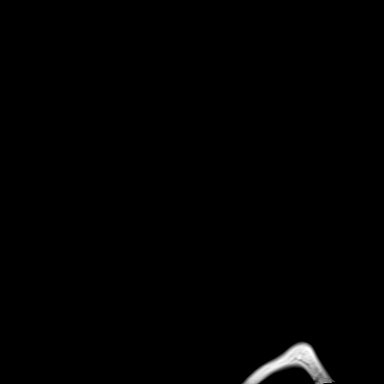
[im 25/25]
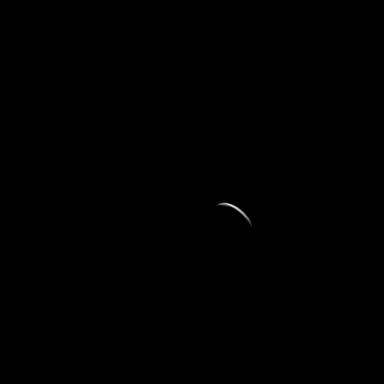

[Series 10: T2 · axial · 5.0mm · 0.53mm/px · z∈[-111,+32]mm · 2 of 25 slices shown (1 of 2)]
[im 1/25]
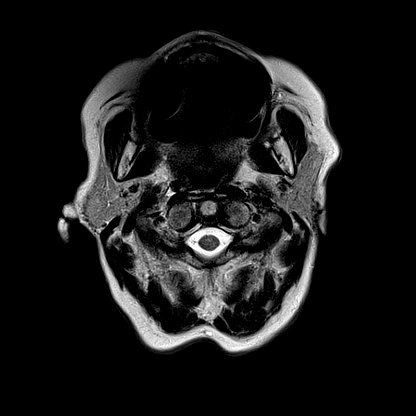
[im 25/25]
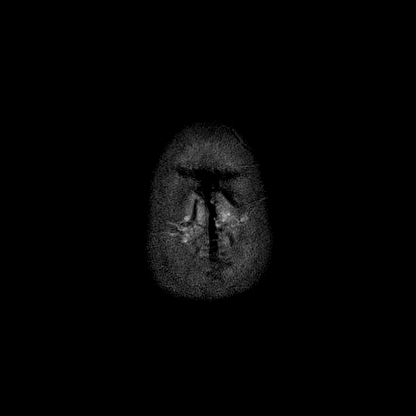

[Series 11: mag_images · axial · 3.0mm · 0.90mm/px · z∈[-127,+49]mm · 4 of 60 slices shown]
[im 1/60]
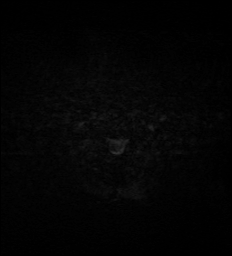
[im 20/60]
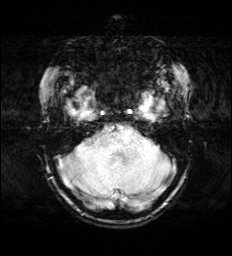
[im 40/60]
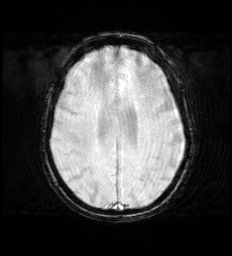
[im 60/60]
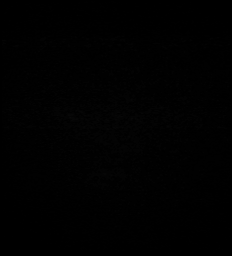

[Series 12: pha_images · axial · 3.0mm · 0.90mm/px · z∈[-124,+49]mm · 4 of 56 slices shown]
[im 1/56]
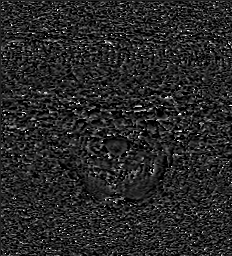
[im 19/56]
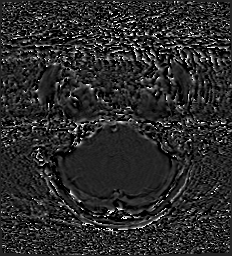
[im 37/56]
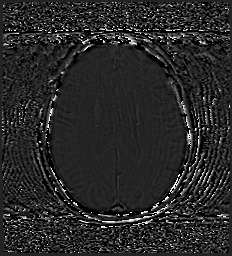
[im 56/56]
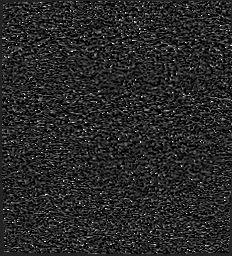

[Series 13: swi_images · axial · 3.0mm · 0.90mm/px · z∈[-127,+49]mm · 4 of 60 slices shown]
[im 1/60]
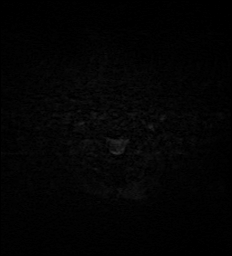
[im 20/60]
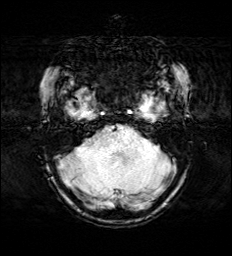
[im 40/60]
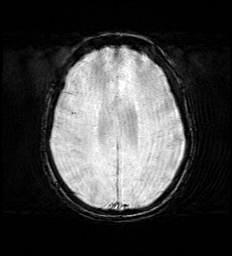
[im 60/60]
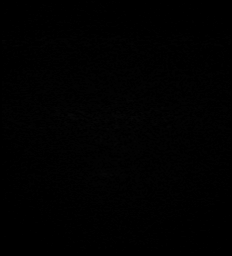

[Series 15: FLAIR · axial · 3.0mm · 0.53mm/px · z∈[-120,+41]mm · 4 of 55 slices shown]
[im 1/55]
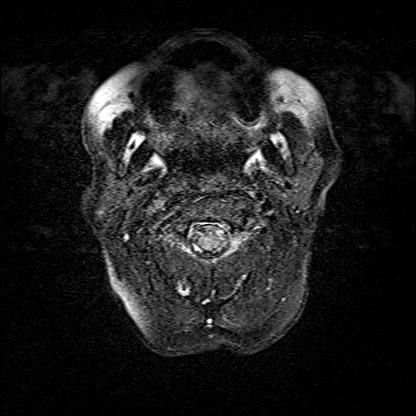
[im 19/55]
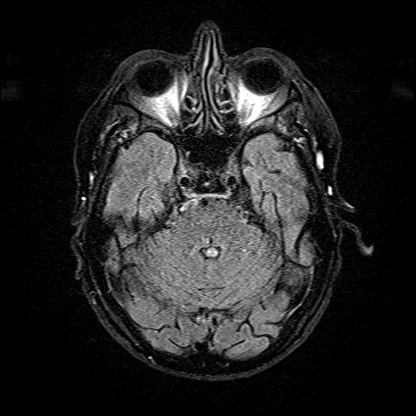
[im 37/55]
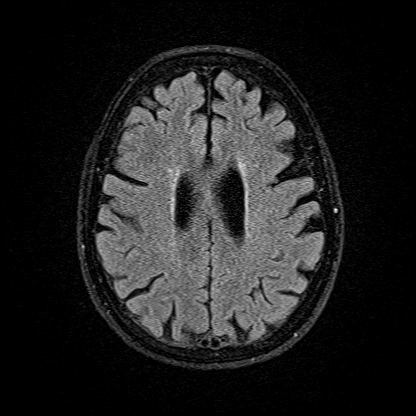
[im 55/55]
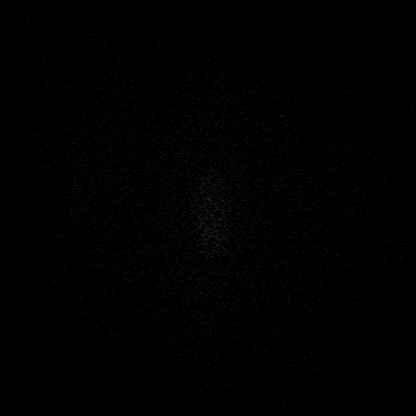

[Series 16: T1 · axial · 1.0mm · 0.98mm/px · z∈[-129,+45]mm · 10 of 176 slices shown (2 of 2)]
[im 1/176]
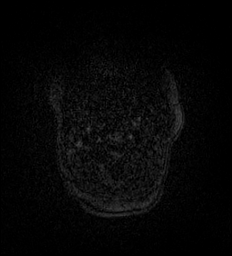
[im 15/176]
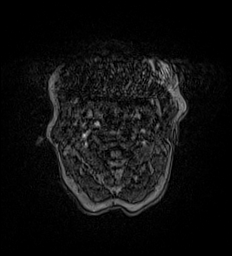
[im 30/176]
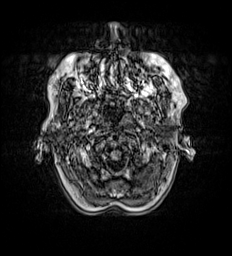
[im 44/176]
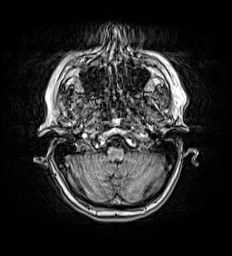
[im 59/176]
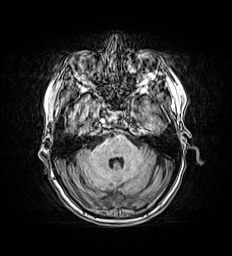
[im 73/176]
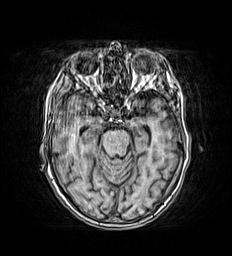
[im 103/176]
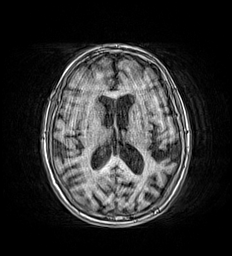
[im 117/176]
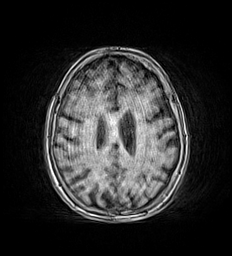
[im 146/176]
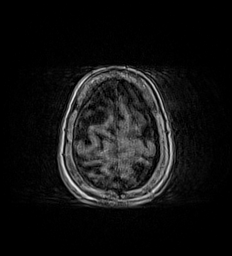
[im 176/176]
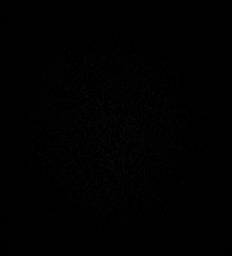

[Series 17: T2 · coronal · 5.0mm · 0.57mm/px · 2 of 29 slices shown (2 of 2)]
[im 1/29]
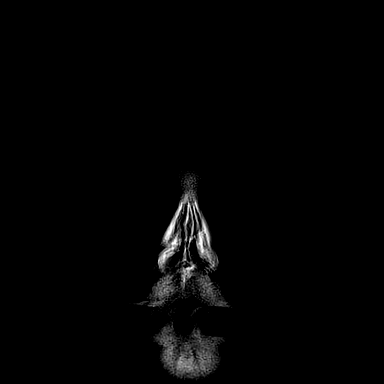
[im 29/29]
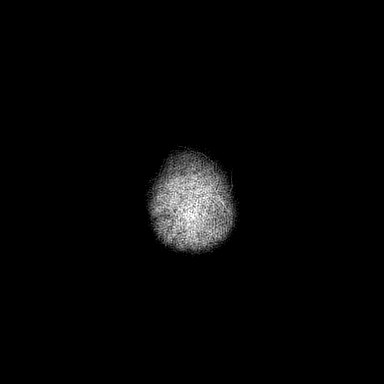

[45 of 48 positions shown; findings below may reference images not displayed]

FINDINGS: Brain: Diffuse cortical atrophy. Mild ventricular enlargement
consistent with atrophy. Negative for acute infarct. Mild patchy
hyperintensity in the cerebral white matter bilaterally most
consistent with chronic microvascular ischemia. Negative for
hemorrhage

Extra-axial mass left frontal convexity measuring approximately 17
mm best seen on FLAIR imaging. Morphology is most likely due to
meningioma however signal characteristics are somewhat atypical on
T1 and T2 therefore contrast enhanced MRI is recommended to confirm.
No significant brain edema.

Vascular: Normal arterial flow voids

Skull and upper cervical spine: No focal skeletal lesion

Sinuses/Orbits: Negative

Other: None
IMPRESSION: Atrophy and mild chronic microvascular ischemia.  No acute infarct

Probable left frontal convexity meningioma. Recommend follow-up MRI
brain with contrast to confirm.

## 2019-11-23 ENCOUNTER — Other Ambulatory Visit: Payer: Self-pay | Admitting: Neurology

## 2019-11-23 ENCOUNTER — Other Ambulatory Visit (HOSPITAL_COMMUNITY): Payer: Self-pay | Admitting: Neurology

## 2019-11-23 DIAGNOSIS — D329 Benign neoplasm of meninges, unspecified: Secondary | ICD-10-CM

## 2019-12-03 ENCOUNTER — Ambulatory Visit: Payer: Medicare Other

## 2019-12-07 ENCOUNTER — Other Ambulatory Visit: Payer: Self-pay

## 2019-12-07 ENCOUNTER — Ambulatory Visit
Admission: RE | Admit: 2019-12-07 | Discharge: 2019-12-07 | Disposition: A | Discharge status: Home / Self Care | Payer: Medicare Other | Source: Ambulatory Visit | Attending: Neurology | Admitting: Neurology

## 2019-12-07 DIAGNOSIS — D329 Benign neoplasm of meninges, unspecified: Secondary | ICD-10-CM | POA: Diagnosis present

## 2019-12-07 IMAGING — MR MR HEAD W/ CM
6 series · 48 of 48 positions shown · IV contrast (5ml Gadavist)
Comparison: Noncontrast brain MRI [DATE]

CLINICAL DATA: Meningioma.

EXAM:
MRI HEAD WITH CONTRAST
TECHNIQUE: Multiplanar, multiecho pulse sequences of the brain and surrounding
structures were obtained with intravenous contrast.
CONTRAST:  5mL GADAVIST GADOBUTROL 1 MMOL/ML IV SOLN

[Series 5: ax dwi_tracew · axial · 3.0mm · 0.60mm/px · z∈[-131,+24]mm · 4 of 48 slices shown]
[im 1/48]
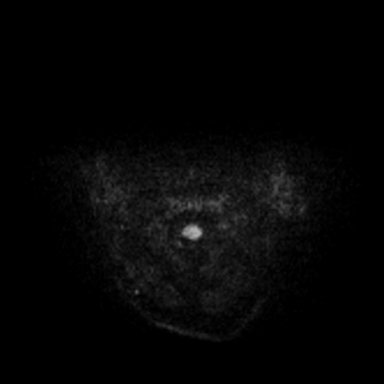
[im 16/48]
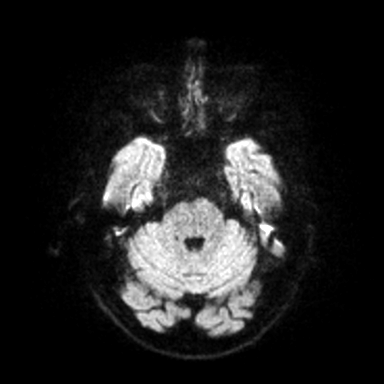
[im 32/48]
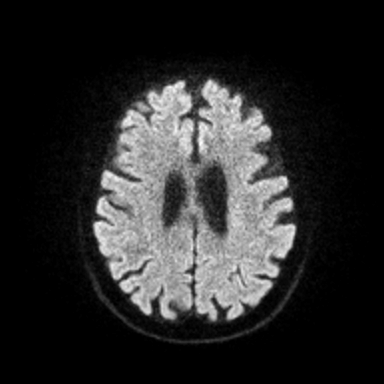
[im 48/48]
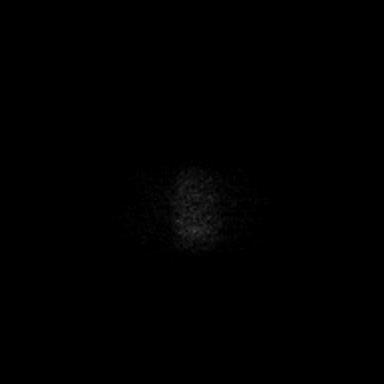

[Series 6: ax dwi_adc · axial · 3.0mm · 0.60mm/px · z∈[-131,+24]mm · 5 of 48 slices shown]
[im 1/48]
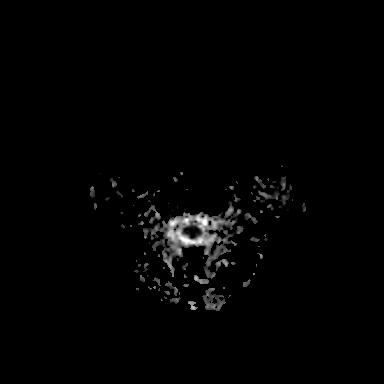
[im 12/48]
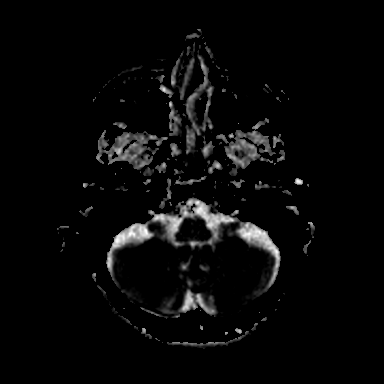
[im 24/48]
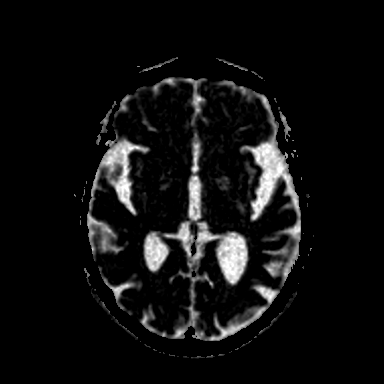
[im 36/48]
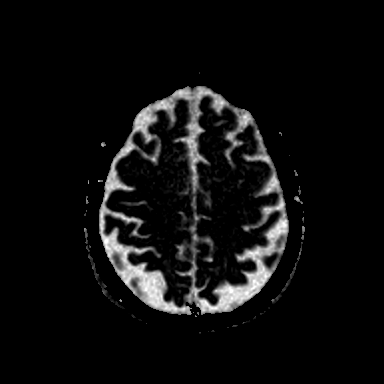
[im 48/48]
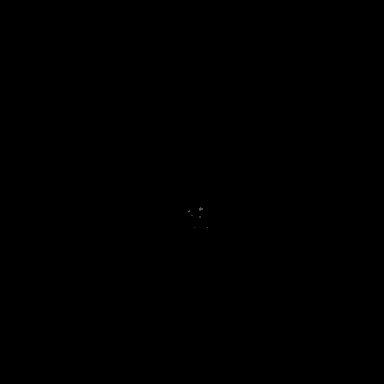

[Series 7: T1 · axial · 1.0mm · 0.98mm/px · z∈[-129,+30]mm · 17 of 160 slices shown]
[im 1/160]
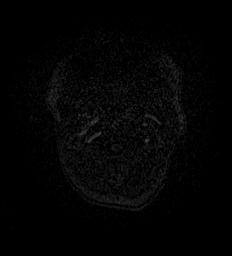
[im 10/160]
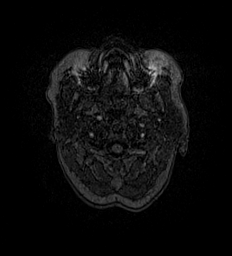
[im 20/160]
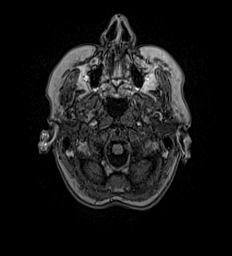
[im 30/160]
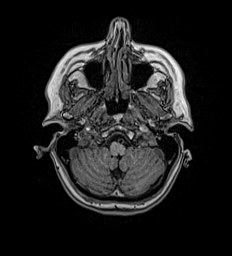
[im 40/160]
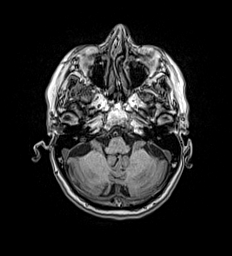
[im 50/160]
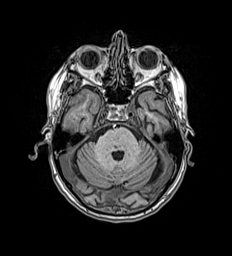
[im 60/160]
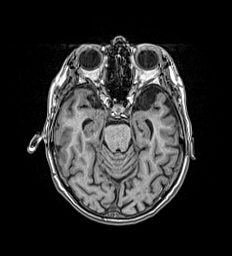
[im 70/160]
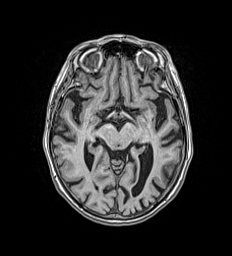
[im 80/160]
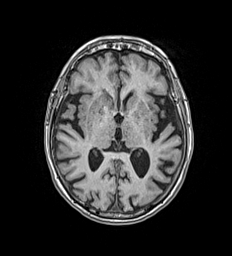
[im 90/160]
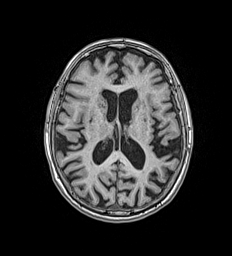
[im 100/160]
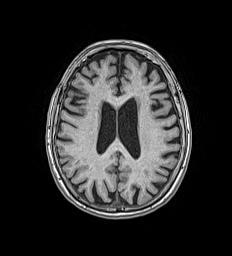
[im 110/160]
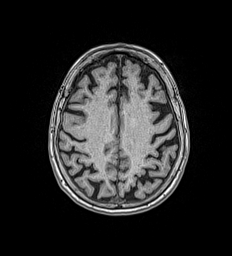
[im 120/160]
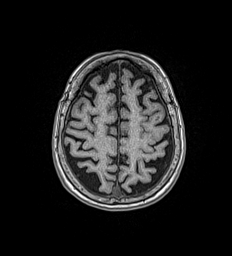
[im 130/160]
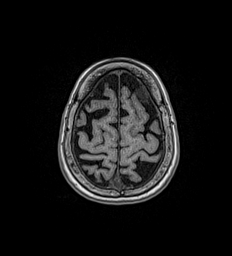
[im 140/160]
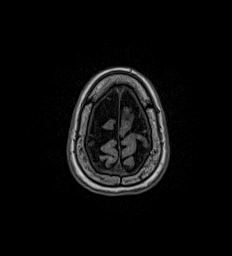
[im 150/160]
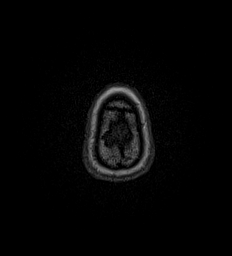
[im 160/160]
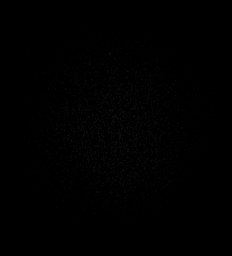

[Series 8: T1 post-contrast · axial · 1.0mm · 0.98mm/px · z∈[-129,+30]mm · 17 of 160 slices shown (1 of 3)]
[im 1/160]
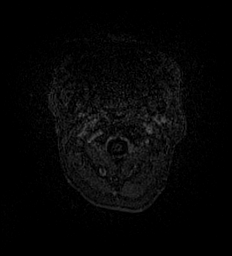
[im 10/160]
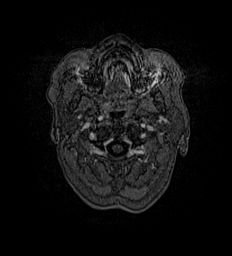
[im 20/160]
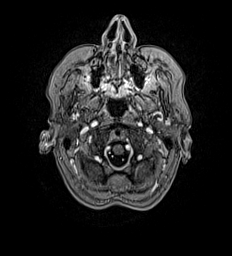
[im 30/160]
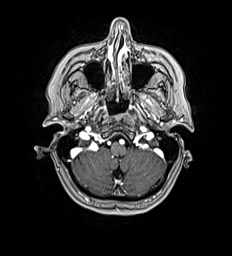
[im 40/160]
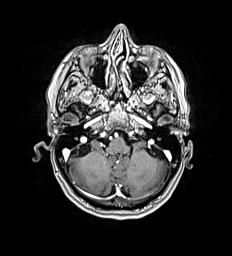
[im 50/160]
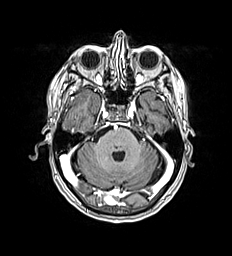
[im 60/160]
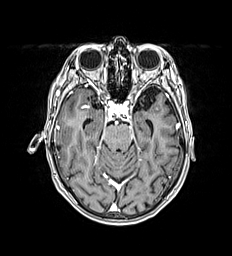
[im 70/160]
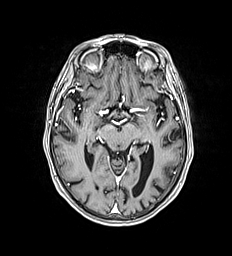
[im 80/160]
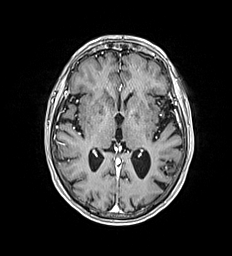
[im 90/160]
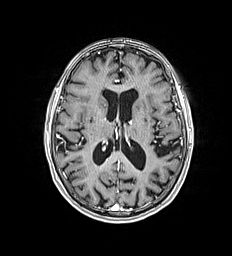
[im 100/160]
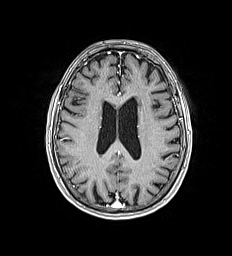
[im 110/160]
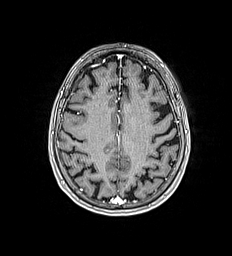
[im 120/160]
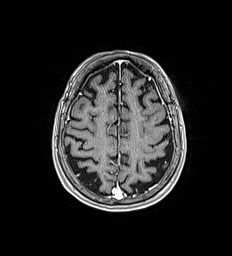
[im 130/160]
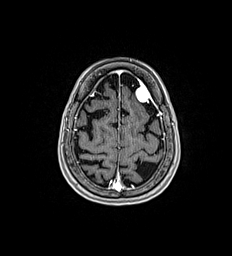
[im 140/160]
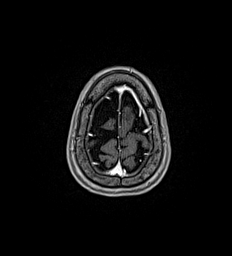
[im 150/160]
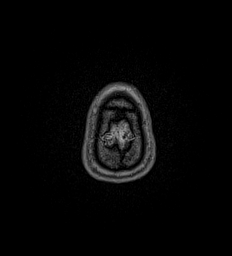
[im 160/160]
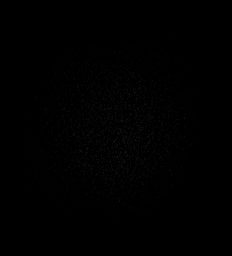

[Series 9: T1 post-contrast · coronal · 5.0mm · 0.57mm/px · 3 of 27 slices shown (2 of 3)]
[im 1/27]
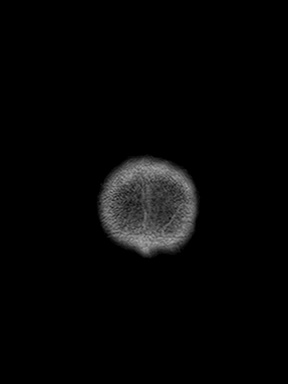
[im 14/27]
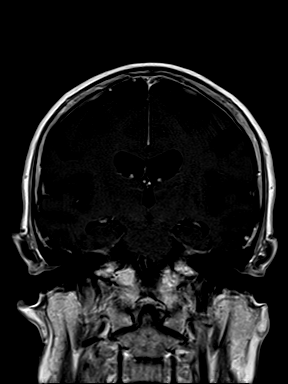
[im 27/27]
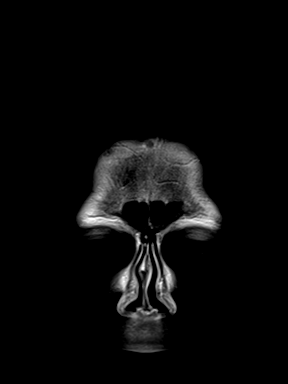

[Series 10: T1 post-contrast · sagittal · 5.0mm · 0.62mm/px · 2 of 21 slices shown (3 of 3)]
[im 1/21]
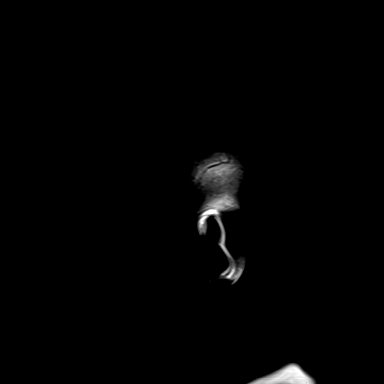
[im 21/21]
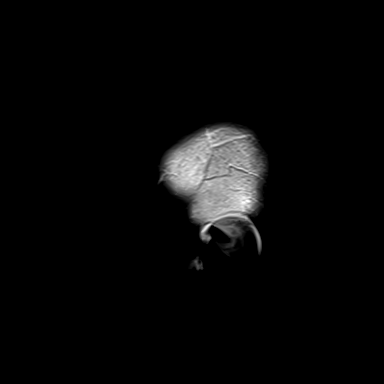

[48 of 48 positions shown; findings below may reference images not displayed]

FINDINGS: A 1.8 x 1.1 cm extra-axial mass over the left frontal convexity
demonstrates avid, homogeneous enhancement and a dural tail. There
is minimal local mass effect without brain edema. No other enhancing
intracranial lesions are identified. There is no acute infarct.
Evaluation of the brain otherwise is deferred to the recent prior
complete noncontrast examination.
IMPRESSION: Enhancing left frontal convexity extra-axial mass consistent with a
meningioma.

## 2019-12-07 MED ORDER — GADOBUTROL 1 MMOL/ML IV SOLN
5.0000 mL | Freq: Once | INTRAVENOUS | Status: AC | PRN
  Administered 2019-12-07: 14:00:00 5 mL via INTRAVENOUS

## 2021-12-23 ENCOUNTER — Emergency Department: Payer: Medicare Other

## 2021-12-23 ENCOUNTER — Other Ambulatory Visit: Payer: Self-pay

## 2021-12-23 ENCOUNTER — Observation Stay: Payer: Medicare Other

## 2021-12-23 ENCOUNTER — Observation Stay
Admission: EM | Admit: 2021-12-23 | Discharge: 2021-12-24 | Disposition: A | Discharge status: Home / Self Care | Payer: Medicare Other | Attending: Internal Medicine | Admitting: Internal Medicine

## 2021-12-23 DIAGNOSIS — Z7982 Long term (current) use of aspirin: Secondary | ICD-10-CM | POA: Diagnosis not present

## 2021-12-23 DIAGNOSIS — F419 Anxiety disorder, unspecified: Secondary | ICD-10-CM | POA: Insufficient documentation

## 2021-12-23 DIAGNOSIS — Z79899 Other long term (current) drug therapy: Secondary | ICD-10-CM | POA: Insufficient documentation

## 2021-12-23 DIAGNOSIS — F03918 Unspecified dementia, unspecified severity, with other behavioral disturbance: Secondary | ICD-10-CM | POA: Diagnosis not present

## 2021-12-23 DIAGNOSIS — I10 Essential (primary) hypertension: Secondary | ICD-10-CM | POA: Diagnosis not present

## 2021-12-23 DIAGNOSIS — R55 Syncope and collapse: Secondary | ICD-10-CM | POA: Diagnosis not present

## 2021-12-23 DIAGNOSIS — R569 Unspecified convulsions: Secondary | ICD-10-CM | POA: Diagnosis present

## 2021-12-23 LAB — COMPREHENSIVE METABOLIC PANEL
ALT: 16 U/L (ref 0–44)
AST: 24 U/L (ref 15–41)
Albumin: 4 g/dL (ref 3.5–5.0)
Alkaline Phosphatase: 78 U/L (ref 38–126)
Anion gap: 9 (ref 5–15)
BUN: 17 mg/dL (ref 8–23)
CO2: 27 mmol/L (ref 22–32)
Calcium: 9.6 mg/dL (ref 8.9–10.3)
Chloride: 107 mmol/L (ref 98–111)
Creatinine, Ser: 1.09 mg/dL — ABNORMAL HIGH (ref 0.44–1.00)
GFR, Estimated: 50 mL/min — ABNORMAL LOW (ref >60)
Glucose, Bld: 158 mg/dL — ABNORMAL HIGH (ref 70–99)
Potassium: 4.3 mmol/L (ref 3.5–5.1)
Sodium: 143 mmol/L (ref 135–145)
Total Bilirubin: 0.5 mg/dL (ref 0.3–1.2)
Total Protein: 6.7 g/dL (ref 6.5–8.1)

## 2021-12-23 LAB — TROPONIN I (HIGH SENSITIVITY): Troponin I (High Sensitivity): 4 ng/L (ref <18)

## 2021-12-23 LAB — PROTIME-INR
INR: 1 (ref 0.8–1.2)
Prothrombin Time: 13 seconds (ref 11.4–15.2)

## 2021-12-23 LAB — CBC WITH DIFFERENTIAL/PLATELET
Abs Immature Granulocytes: 0.02 10*3/uL (ref 0.00–0.07)
Basophils Absolute: 0.1 10*3/uL (ref 0.0–0.1)
Basophils Relative: 1 %
Eosinophils Absolute: 0.3 10*3/uL (ref 0.0–0.5)
Eosinophils Relative: 4 %
HCT: 41.4 % (ref 36.0–46.0)
Hemoglobin: 13.1 g/dL (ref 12.0–15.0)
Immature Granulocytes: 0 %
Lymphocytes Relative: 23 %
Lymphs Abs: 1.4 10*3/uL (ref 0.7–4.0)
MCH: 29.1 pg (ref 26.0–34.0)
MCHC: 31.6 g/dL (ref 30.0–36.0)
MCV: 92 fL (ref 80.0–100.0)
Monocytes Absolute: 0.5 10*3/uL (ref 0.1–1.0)
Monocytes Relative: 7 %
Neutro Abs: 4 10*3/uL (ref 1.7–7.7)
Neutrophils Relative %: 65 %
Platelets: 258 10*3/uL (ref 150–400)
RBC: 4.5 MIL/uL (ref 3.87–5.11)
RDW: 13.3 % (ref 11.5–15.5)
WBC: 6.2 10*3/uL (ref 4.0–10.5)
nRBC: 0 % (ref 0.0–0.2)

## 2021-12-23 LAB — D-DIMER, QUANTITATIVE: D-Dimer, Quant: 0.95 ug/mL-FEU — ABNORMAL HIGH (ref 0.00–0.50)

## 2021-12-23 LAB — APTT: aPTT: 31 seconds (ref 24–36)

## 2021-12-23 IMAGING — MR MR HEAD W/O CM
9 series · 48 of 48 positions shown · non-contrast
Comparison: [DATE]

CLINICAL DATA: Altered mental status

EXAM:
MRI HEAD WITHOUT CONTRAST
TECHNIQUE: Multiplanar, multiecho pulse sequences of the brain and surrounding
structures were obtained without intravenous contrast.

[Series 5: ax dwi_tracew · axial · 3.0mm · 1.80mm/px · z∈[-121,+33]mm · 6 of 48 slices shown]
[im 1/48]
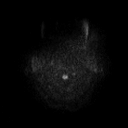
[im 10/48]
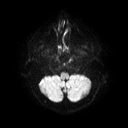
[im 19/48]
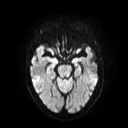
[im 29/48]
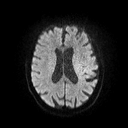
[im 38/48]
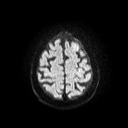
[im 48/48]
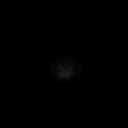

[Series 6: ax dwi_adc · axial · 3.0mm · 1.80mm/px · z∈[-121,+33]mm · 6 of 48 slices shown]
[im 1/48]
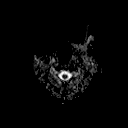
[im 10/48]
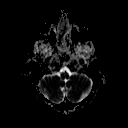
[im 19/48]
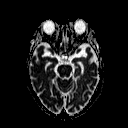
[im 29/48]
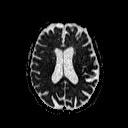
[im 38/48]
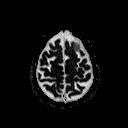
[im 48/48]
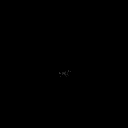

[Series 7: cor dwi_tracew · coronal · 5.0mm · 1.80mm/px · 5 of 38 slices shown]
[im 1/38]
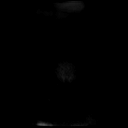
[im 10/38]
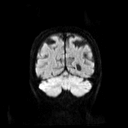
[im 19/38]
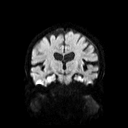
[im 28/38]
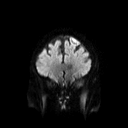
[im 38/38]
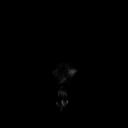

[Series 8: cor dwi_adc · coronal · 5.0mm · 1.80mm/px · 5 of 38 slices shown]
[im 1/38]
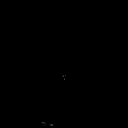
[im 10/38]
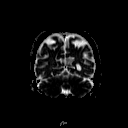
[im 19/38]
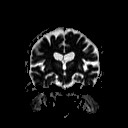
[im 28/38]
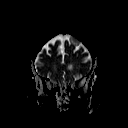
[im 38/38]
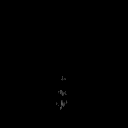

[Series 9: T1 · sagittal · 5.0mm · 0.62mm/px · 3 of 23 slices shown]
[im 1/23]
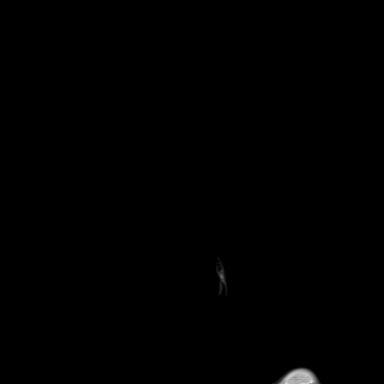
[im 12/23]
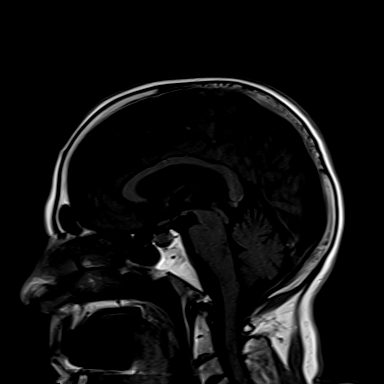
[im 23/23]
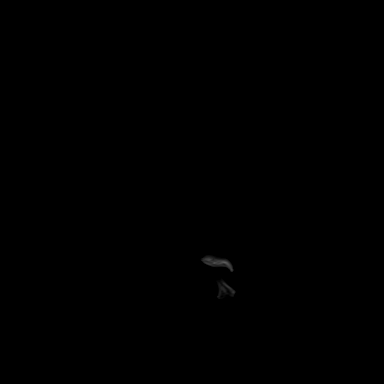

[Series 10: T2 · axial · 5.0mm · 0.86mm/px · z∈[-117,+38]mm · 4 of 27 slices shown]
[im 1/27]
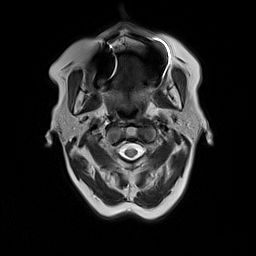
[im 9/27]
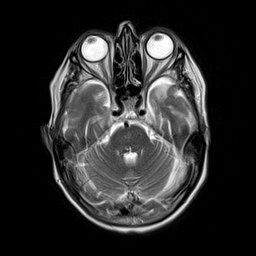
[im 18/27]
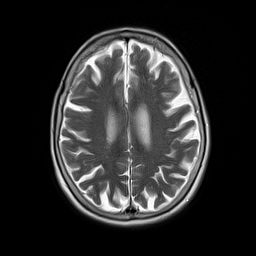
[im 27/27]
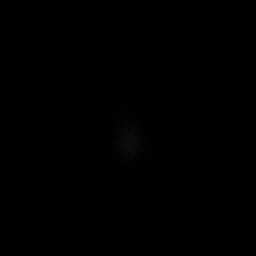

[Series 12: pha_images · axial · 3.0mm · 0.90mm/px · z∈[-98,+19]mm · 5 of 37 slices shown]
[im 1/37]
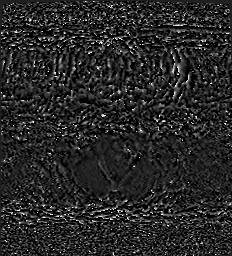
[im 10/37]
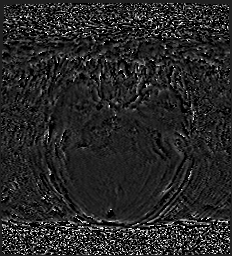
[im 19/37]
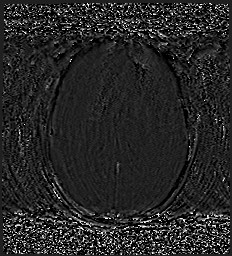
[im 28/37]
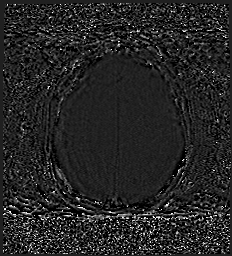
[im 37/37]
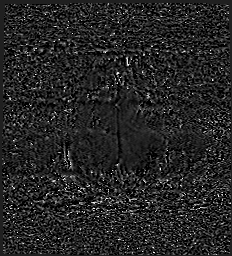

[Series 13: swi_images · axial · 3.0mm · 0.90mm/px · z∈[-115,+37]mm · 7 of 52 slices shown]
[im 1/52]
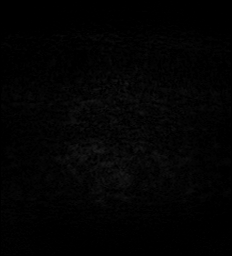
[im 9/52]
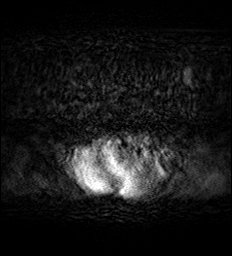
[im 18/52]
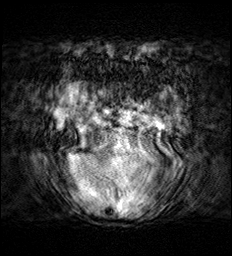
[im 26/52]
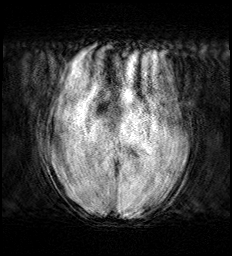
[im 35/52]
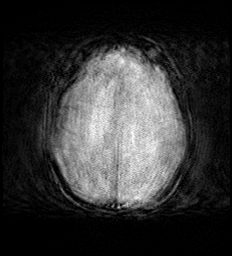
[im 43/52]
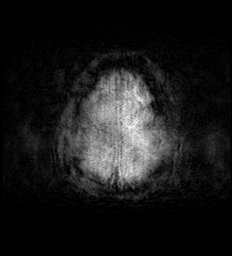
[im 52/52]
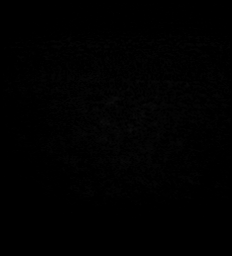

[Series 15: FLAIR · axial · 3.0mm · 0.69mm/px · z∈[-120,+41]mm · 7 of 55 slices shown]
[im 1/55]
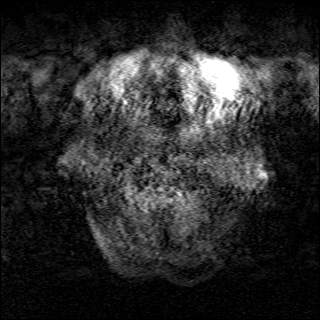
[im 10/55]
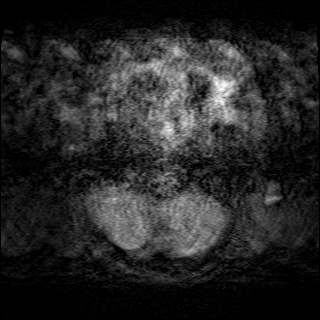
[im 19/55]
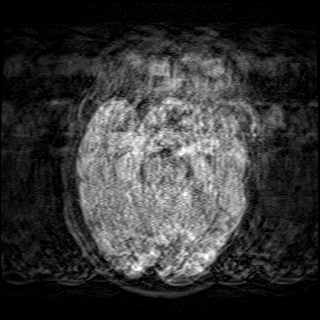
[im 28/55]
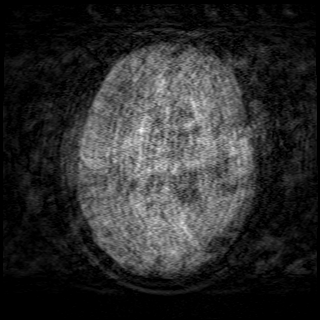
[im 37/55]
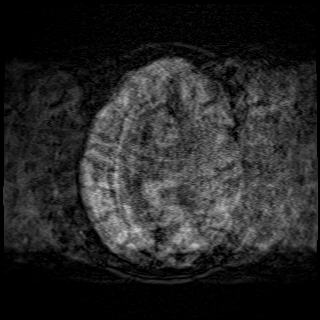
[im 46/55]
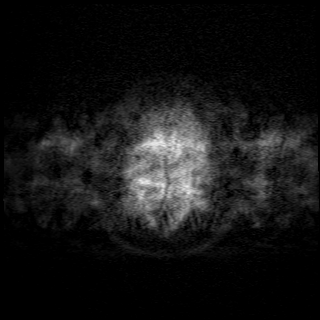
[im 55/55]
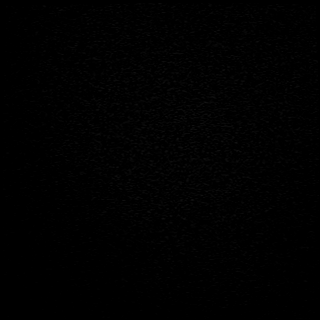

[48 of 48 positions shown; findings below may reference images not displayed]

FINDINGS: Examination was discontinued prior to completion by patient request.
Images are severely motion degraded.

Brain: No acute infarct, mass effect or extra-axial collection.
There is multifocal hyperintense T2-weighted signal within the white
matter. Generalized cerebral volume loss. There is a superior left
convexity meningioma measuring 17 mm, previously 12 mm.

Vascular: Major flow voids are preserved.

Skull and upper cervical spine: Normal calvarium and skull base.
Visualized upper cervical spine and soft tissues are normal.

Sinuses/Orbits:No paranasal sinus fluid levels or advanced mucosal
thickening. No mastoid or middle ear effusion. Normal orbits.
IMPRESSION: 1. Severely motion degraded examination.
2. No acute intracranial abnormality.
3. Slight increase in size of superior left convexity meningioma.

## 2021-12-23 IMAGING — CT CT HEAD W/O CM
4 series · 17 of 47 positions shown, 19 images · non-contrast
Comparison: MRI head with contrast [DATE]

CLINICAL DATA: Mental status change.  Unresponsive



[Series 2: head wo · axial · 0.40mm/px · z∈[-107,+3]mm · 7 of 30 slices shown, 9 images]
[im 4/30  brain]
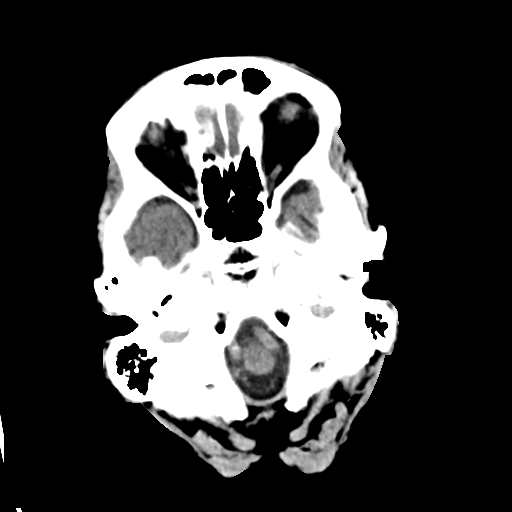
[im 4/30  bone]
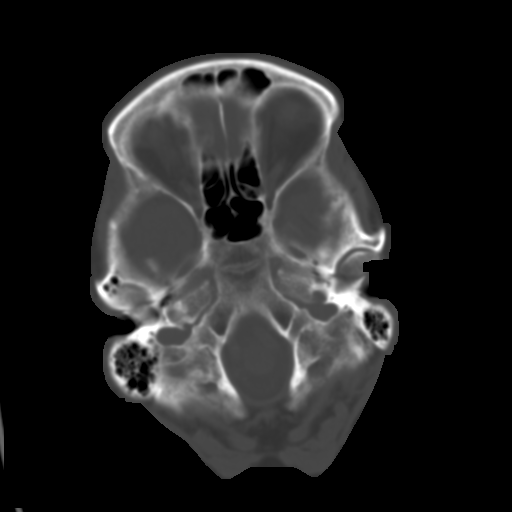
[im 8/30  brain]
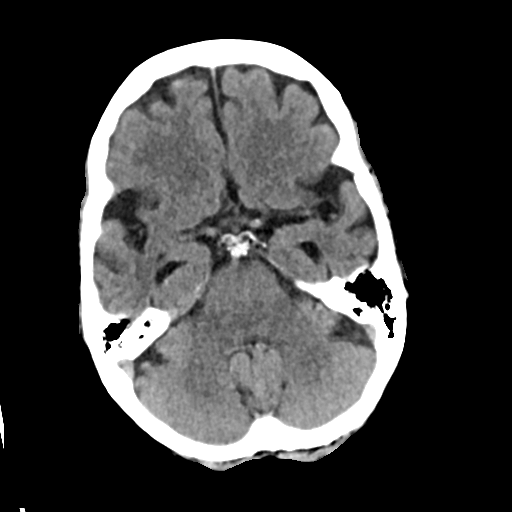
[im 11/30  brain]
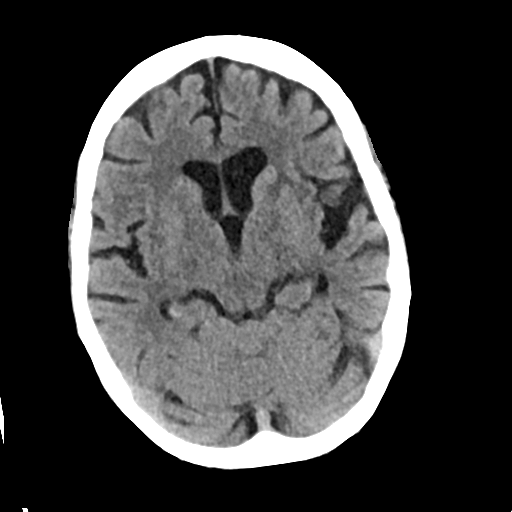
[im 15/30  brain]
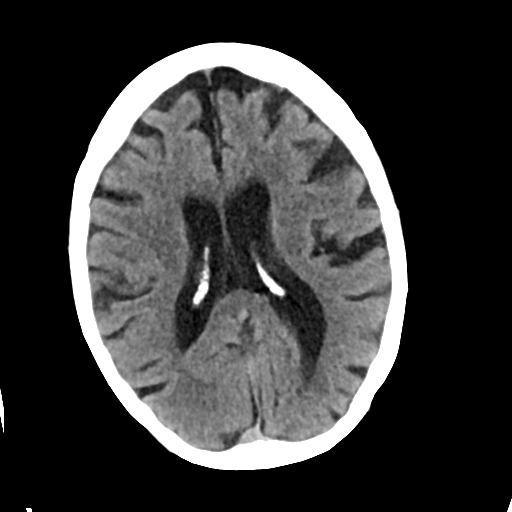
[im 19/30  brain]
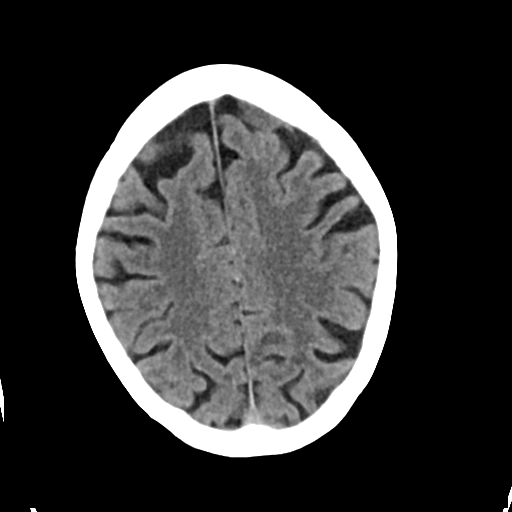
[im 19/30  bone]
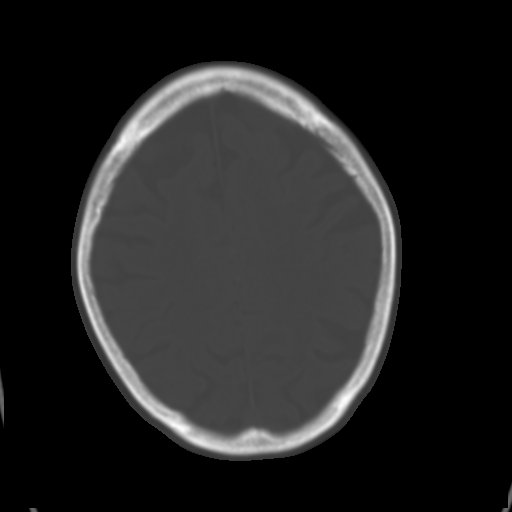
[im 22/30  brain]
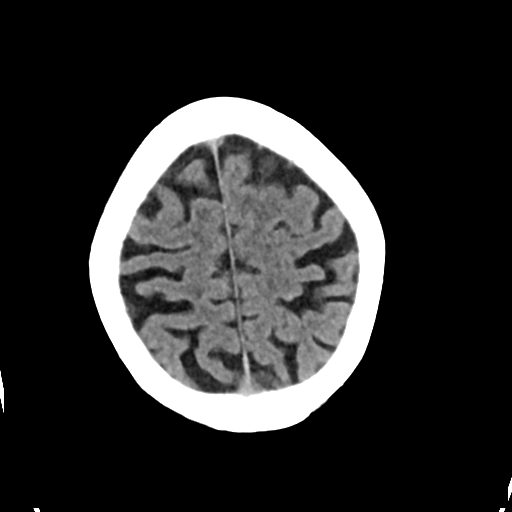
[im 26/30  brain]
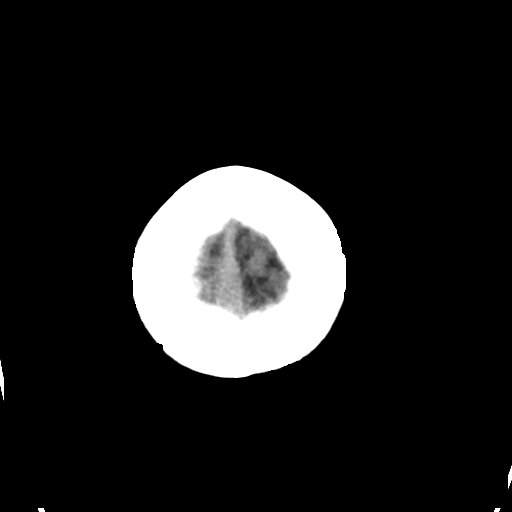

[Series 3: head bone · axial · 0.40mm/px · z∈[-108,-58]mm · 4 of 73 slices shown]
[im 8/73  bone]
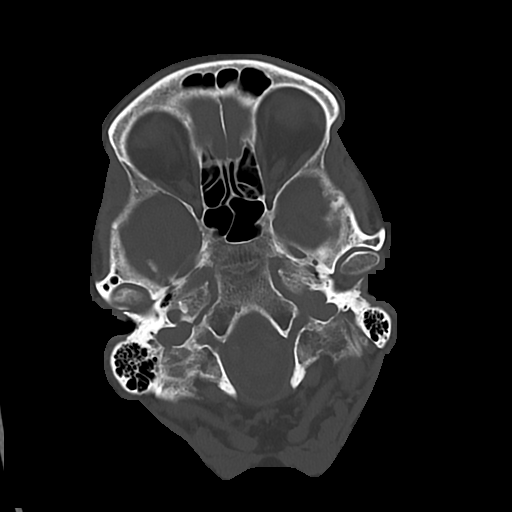
[im 15/73  bone]
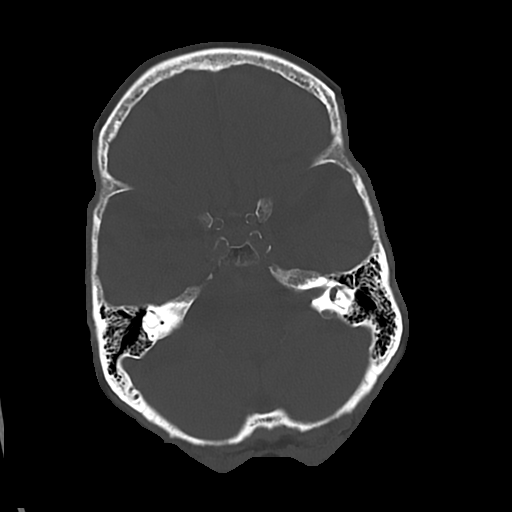
[im 22/73  bone]
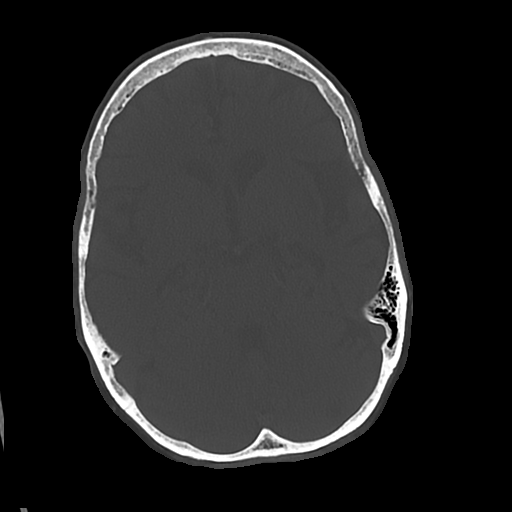
[im 33/73  bone]
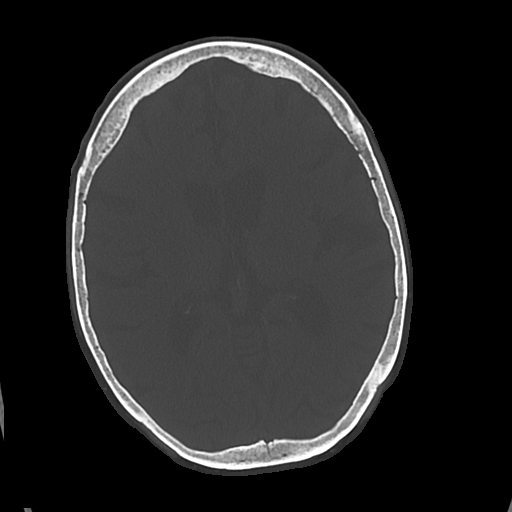

[Series 4: cor soft · coronal · 0.31mm/px · 3 of 62 slices shown]
[im 21/62  brain]
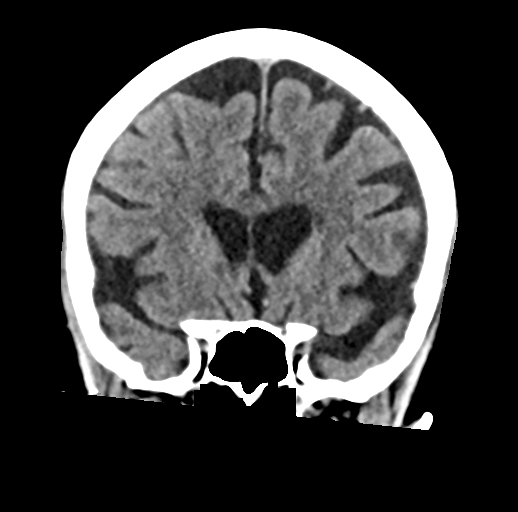
[im 28/62  brain]
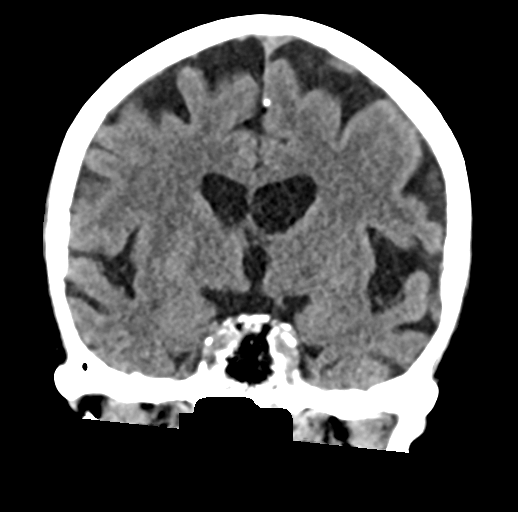
[im 34/62  brain]
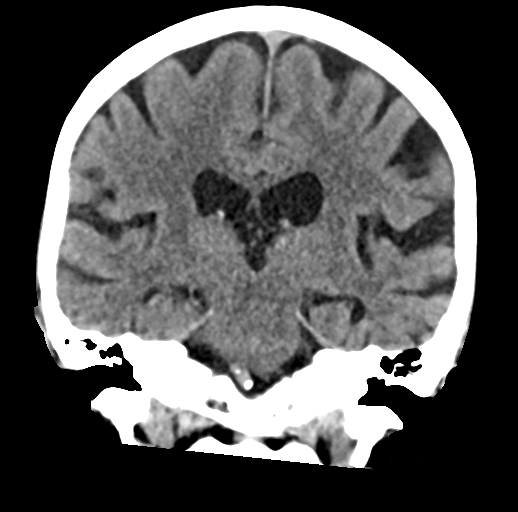

[Series 5: sag soft · sagittal · 0.31mm/px · 3 of 53 slices shown]
[im 18/53  brain]
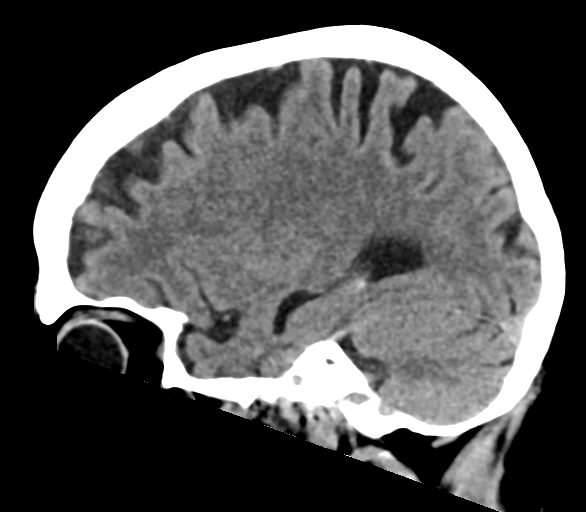
[im 27/53  brain]
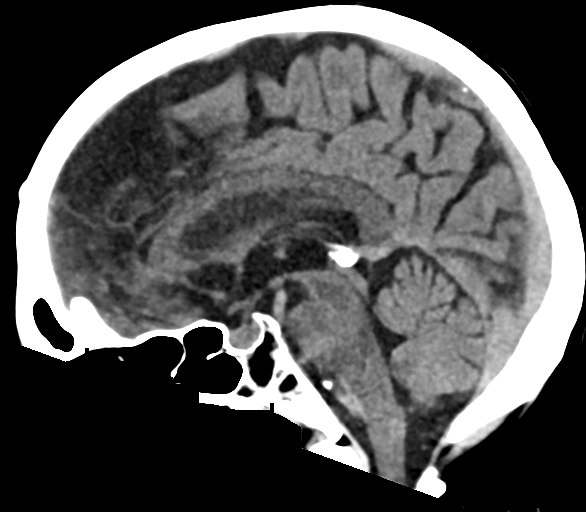
[im 35/53  brain]
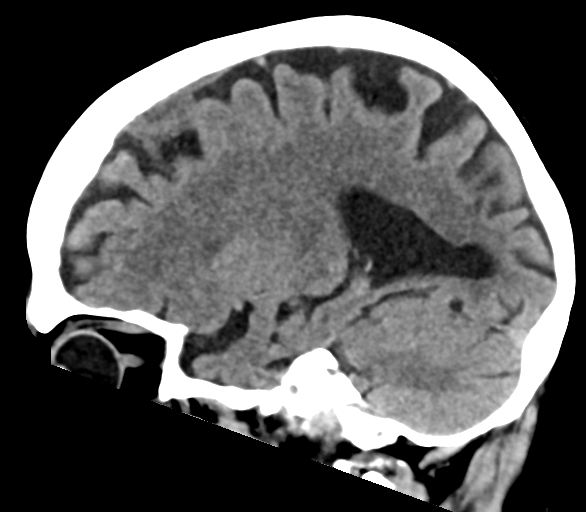

[17 of 47 positions shown; findings below may reference images not displayed]

FINDINGS: Brain: Generalized atrophy. Negative for intracranial hemorrhage or
acute infarct

Left frontal extra-axial mass measuring 22 x 10 mm compatible with
meningioma. Mild growth since the prior study. No brain edema

Vascular: Negative for hyperdense vessel

Skull: Negative

Sinuses/Orbits: Negative

Other: None
IMPRESSION: No acute abnormality

Left frontal meningioma with mild growth since the prior MRI [DATE].

## 2021-12-23 MED ORDER — LORAZEPAM 0.5 MG PO TABS: 0.5000 mg | ORAL_TABLET | Freq: Three times a day (TID) | ORAL | Status: DC | PRN

## 2021-12-23 MED ORDER — DONEPEZIL HCL 5 MG PO TABS: 5.0000 mg | ORAL_TABLET | Freq: Every day | ORAL | Status: DC

## 2021-12-23 MED ORDER — POLYETHYLENE GLYCOL 3350 17 G PO PACK: 17.0000 g | PACK | Freq: Every day | ORAL | Status: DC | PRN

## 2021-12-23 MED ORDER — DOCUSATE SODIUM 100 MG PO CAPS: 100.0000 mg | ORAL_CAPSULE | Freq: Two times a day (BID) | ORAL | Status: DC

## 2021-12-23 MED ORDER — MELATONIN 5 MG PO TABS: 2.5000 mg | ORAL_TABLET | Freq: Every day | ORAL | Status: DC

## 2021-12-23 MED ORDER — SODIUM CHLORIDE 0.9% FLUSH: 3.0000 mL | Freq: Two times a day (BID) | INTRAVENOUS | Status: DC

## 2021-12-23 MED ORDER — ACETAMINOPHEN 325 MG RE SUPP: 650.0000 mg | Freq: Four times a day (QID) | RECTAL | Status: DC | PRN

## 2021-12-23 MED ORDER — BISACODYL 5 MG PO TBEC: 5.0000 mg | DELAYED_RELEASE_TABLET | Freq: Every day | ORAL | Status: DC | PRN

## 2021-12-23 MED ORDER — HEPARIN SODIUM (PORCINE) 5000 UNIT/ML IJ SOLN: 5000.0000 [IU] | Freq: Three times a day (TID) | INTRAMUSCULAR | Status: DC

## 2021-12-23 MED ORDER — LISINOPRIL 10 MG PO TABS
10.0000 mg | ORAL_TABLET | Freq: Every day | ORAL | Status: DC
Start: 2021-12-24 — End: 2021-12-24

## 2021-12-23 MED ORDER — LACTATED RINGERS IV SOLN: INTRAVENOUS | Status: DC

## 2021-12-23 MED ORDER — SERTRALINE HCL 50 MG PO TABS: 75.0000 mg | ORAL_TABLET | Freq: Every day | ORAL | Status: DC

## 2021-12-23 MED ORDER — ACETAMINOPHEN 325 MG PO TABS
650.0000 mg | ORAL_TABLET | Freq: Four times a day (QID) | ORAL | Status: DC | PRN
Start: 2021-12-23 — End: 2021-12-24

## 2021-12-23 MED ORDER — SIMVASTATIN 10 MG PO TABS: 20.0000 mg | ORAL_TABLET | Freq: Every day | ORAL | Status: DC

## 2021-12-23 MED ORDER — FLEET ENEMA 7-19 GM/118ML RE ENEM: 1.0000 | ENEMA | Freq: Once | RECTAL | Status: DC | PRN

## 2021-12-23 MED ORDER — MIRTAZAPINE 15 MG PO TABS: 7.5000 mg | ORAL_TABLET | Freq: Every day | ORAL | Status: DC

## 2021-12-23 MED ORDER — ATORVASTATIN CALCIUM 20 MG PO TABS
10.0000 mg | ORAL_TABLET | Freq: Every day | ORAL | Status: DC
Start: 2021-12-24 — End: 2021-12-23

## 2021-12-23 MED ORDER — ASPIRIN EC 81 MG PO TBEC: 81.0000 mg | DELAYED_RELEASE_TABLET | Freq: Every day | ORAL | Status: DC

## 2021-12-23 NOTE — ED Notes (Signed)
First nurse note-pt brought in via ems from [facility].  Pt was at [employer] with daughter and had episode of shaking lasting for 10 seconds per ems  hx dementia.  Pt alert, sitting in wheelchair ,family with pt.  ?

## 2021-12-23 NOTE — ED Provider Notes (Signed)
? [facility] ?Provider Note ? ? ? Event Date/Time  ? First MD Initiated Contact with Patient 12/23/21 2024   ?  (approximate) ? ? ?History  ? ?Seizures ? ? ?HPI ? ?Susan Moss is a 88 y.o. female who presents to the ED for evaluation of Seizures ?  ?I reviewed neurology clinic visit from July 2021.  History of cognitive impairment/dementia, left frontal meningioma, HTN, HLD. ? ?88 year old female presents to the ED after episode of witnessed syncope versus seizure.  She is pleasantly disoriented, has no complaints and does not recall what happened. ? ?Family at the bedside provides all history.  They report they were sitting down and eating at a local [employer] at about 2:30 PM this afternoon.  Patient was at her baseline and then suddenly slumped forward and was unresponsive for nearly 1 minute.  Had generalized tremulousness to all 4 extremities alongside this.  No tongue biting or incontinence.  Back to baseline after this and no recurrence of these symptoms.  She is at her baseline now. ? ?Physical Exam  ? ?Triage Vital Signs: ?ED Triage Vitals  ?Enc Vitals Group  ?   BP 12/23/21 1701 124/87  ?   Pulse Rate 12/23/21 1701 66  ?   Resp 12/23/21 1701 16  ?   Temp 12/23/21 1701 97.8 ?F (36.6 ?C)  ?   Temp Source 12/23/21 1701 Oral  ?   SpO2 12/23/21 1701 98 %  ?   Weight 12/23/21 1659 125 lb (56.7 kg)  ?   Height 12/23/21 1659 '4\' 11"'$  (1.499 m)  ?   Head Circumference --   ?   Peak Flow --   ?   Pain Score 12/23/21 1659 0  ?   Pain Loc --   ?   Pain Edu? --   ?   Excl. in [place]? --   ? ? ?Most recent vital signs: ?Vitals:  ? 12/23/21 1701 12/23/21 2035  ?BP: 124/87 (!) 149/67  ?Pulse: 66 (!) 58  ?Resp: 16 19  ?Temp: 97.8 ?F (36.6 ?C)   ?SpO2: 98% 97%  ? ? ?General: Awake, no distress.  Stands up and is ambulatory with normal gait independently. ?CV:  Good peripheral perfusion.  ?Resp:  Normal effort.  ?Abd:  No distention.  ?MSK:  No deformity noted.  ?Neuro:  No focal deficits appreciated.  Cranial nerves II through XII intact ?5/5 strength and sensation in all 4 extremities ?Other:   ? ? ?ED Results / Procedures / Treatments  ? ?Labs ?(all labs ordered are listed, but only abnormal results are displayed) ?Labs Reviewed  ?COMPREHENSIVE METABOLIC PANEL - Abnormal; Notable for the following components:  ?    Result Value  ? Glucose, Bld 158 (*)   ? Creatinine, Ser 1.09 (*)   ? GFR, Estimated 50 (*)   ? All other components within normal limits  ?CBC WITH DIFFERENTIAL/PLATELET  ?PROTIME-INR  ?APTT  ?URINALYSIS, ROUTINE W REFLEX MICROSCOPIC  ?TROPONIN I (HIGH SENSITIVITY)  ?TROPONIN I (HIGH SENSITIVITY)  ? ? ?EKG ?Sinus rhythm, rate of 66 bpm.  Normal axis.  Left anterior fascicular block.  No STEMI.  1 PVC. ? ?RADIOLOGY ?CXR reviewed by me without evidence of acute cardiopulmonary pathology. ?CT head reviewed by me without evidence of acute intracranial pathology ? ?Official radiology report(s): ?DG Chest 2 View ? ?Result Date: 12/23/2021 ?CLINICAL DATA:  Syncope seizure EXAM: CHEST - 2 VIEW COMPARISON:  None Available. FINDINGS: The heart size and mediastinal contours are  within normal limits. Both lungs are clear aortic atherosclerosis. The visualized skeletal structures are unremarkable. IMPRESSION: No active cardiopulmonary disease. Electronically Signed   By: Donavan Foil M.D.   On: 12/23/2021 17:52  ? ?CT Head Wo Contrast ? ?Result Date: 12/23/2021 ?CLINICAL DATA:  Mental status change.  Unresponsive EXAM: CT HEAD WITHOUT CONTRAST TECHNIQUE: Contiguous axial images were obtained from the base of the skull through the vertex without intravenous contrast. RADIATION DOSE REDUCTION: This exam was performed according to the departmental dose-optimization program which includes automated exposure control, adjustment of the mA and/or kV according to patient size and/or use of iterative reconstruction technique. COMPARISON:  MRI head with contrast 12/07/2019 FINDINGS: Brain: Generalized atrophy. Negative for  intracranial hemorrhage or acute infarct Left frontal extra-axial mass measuring 22 x 10 mm compatible with meningioma. Mild growth since the prior study. No brain edema Vascular: Negative for hyperdense vessel Skull: Negative Sinuses/Orbits: Negative Other: None IMPRESSION: No acute abnormality Left frontal meningioma with mild growth since the prior MRI 2019. Electronically Signed   By: Franchot Gallo M.D.   On: 12/23/2021 17:38   ? ?PROCEDURES and INTERVENTIONS: ? ?.1-3 Lead EKG Interpretation ?Performed by: Vladimir Crofts, MD ?Authorized by: Vladimir Crofts, MD  ? ?  Interpretation: normal   ?  ECG rate:  60 ?  ECG rate assessment: normal   ?  Rhythm: sinus rhythm   ?  Ectopy: none   ?  Conduction: normal   ? ?Medications - No data to display ? ? ?IMPRESSION / MDM / ASSESSMENT AND PLAN / ED COURSE  ?I reviewed the triage vital signs and the nursing notes. ? ?Pleasantly demented 88 year old female presents to the ED after a witnessed episode of syncope versus seizure requiring observation admission.  She is asymptomatic here in the ED and does not recall what happened.  She has reassuring examination without evidence of acute neurologic or vascular deficits.  No signs of trauma.  No signs of tongue biting.  The work-up is benign with normal CBC and metabolic panel.  CT head shows chronic and known frontal meningioma, unlikely to be contributing.  She is maintained on the monitor without dysrhythmias or interval changes on her EKG to suggest cardiogenic syncope.  Considering the sudden nature of her episode, the possibility of dysrhythmia, seizure are concerning.  We will consult with medicine for admission. ? ?  ? ? ?FINAL CLINICAL IMPRESSION(S) / ED DIAGNOSES  ? ?Final diagnoses:  ?Syncope and collapse  ? ? ? ?Rx / DC Orders  ? ?ED Discharge Orders   ? ? None  ? ?  ? ? ? ?Note:  This document was prepared using Dragon voice recognition software and may include unintentional dictation errors. ?  ?Vladimir Crofts,  MD ?12/23/21 2104 ? ?

## 2021-12-23 NOTE — ED Triage Notes (Signed)
Pt BIB EMS for seizure like activity. Pt denies headache. Pt was with family member when she become unresponsive and started having jerking movements. Seizure activity last about 30-45 seconds. Pt woke up and did not remember the episode.  ?

## 2021-12-23 NOTE — ED Notes (Signed)
Patient provided with sandwich box and water.  Family at bedside.  Patient currently alert and oriented ?

## 2021-12-23 NOTE — Assessment & Plan Note (Signed)
Prn lorazepam from home regimen resumed.  ?

## 2021-12-23 NOTE — Assessment & Plan Note (Signed)
D/w family that we have to do mari and echo and carotid dopplers for syncope eval and if dimer elevated we may consider PE eval. ?Aspiration/ fall precaution.  ?  ?

## 2021-12-23 NOTE — Assessment & Plan Note (Signed)
Blood pressure (!) 151/80, pulse (!) 55, temperature 97.8 ?F (36.6 ?C), temperature source Oral, resp. rate 17, height '4\' 11"'$  (1.499 m), weight 56.7 kg, SpO2 97 %. ?pt continued on lisinopril. Susan Moss ?

## 2021-12-23 NOTE — ED Notes (Signed)
Pt family to desk requesting to see nurse or provider. Stating they would like pt to be discharged  back to facility. Dr. Posey Pronto notified via secure with primary nurse added to conversation. Dr. Posey Pronto at bedside to speak with family with this nurse present. Family states they would prefer to transport pt back to facility with Dr. Posey Pronto agreeable. Charge nurse Caryl Pina and Primary nurse made aware. ?

## 2021-12-23 NOTE — Assessment & Plan Note (Signed)
We will cont Remeron, Aricept and  Zoloft and ativan prn.  ?

## 2021-12-23 NOTE — ED Provider Triage Note (Signed)
Emergency Medicine Provider Triage Evaluation Note ? ?Susan Moss , a 88 y.o. female  was evaluated in triage.  Pt complains of transient syncope/seizure-like activity.  Patient was with her daughter eating lunch when she had a general tonic-clonic Shaking with unresponsive..  No history of same.  History of meningioma diagnosed 3 years ago.  Patient denies any complaints currently.. ? ?Review of Systems  ?Positive: Transient LOC/general tonic-clonic shaking ?Negative: Fevers, chills, headache, visual changes, chest pain, GI symptoms, urinary changes. ? ?Physical Exam  ?There were no vitals taken for this visit. ?Gen:   Awake, no distress   ?Resp:  Normal effort  ?MSK:   Moves extremities without difficulty  ?Other:  Grossly neurologically intact at this time ? ?Medical Decision Making  ?Medically screening exam initiated at 4:56 PM.  Appropriate orders placed.  Susan Moss was informed that the remainder of the evaluation will be completed by another provider, this initial triage assessment does not replace that evaluation, and the importance of remaining in the ED until their evaluation is complete. ? ?Patient presents with tonic-clonic shaking/syncopal episode.  Patient will have labs, EKG, chest x-ray and CT scan of the head at this time. ?  ?Susan Moll, PA-C ?12/23/21 1702 ? ?

## 2021-12-23 NOTE — H&P (Signed)
?History and Physical  ? ? ?Patient: Susan Moss DGL:[id number] DOB: March 20, 1934 ?DOA: 12/23/2021 ?DOS: the patient was seen and examined on 12/23/2021 ?[facility]P: [facility]  ?Patient coming from: ALF/ILF ? ?Chief Complaint:  ?Chief Complaint  ?Patient presents with  ? Seizures  ? ?HPI: Susan Moss is a 88 y.o. female with medical history significant of hypertension, hypercholesterolemia presenting with syncopal and collapse episode today. ? ?Per daughter at bedside : ?Family took pt to lunch for a frosty and about half way thru she slumped over in mid conversation and was shaking - 45 seconds. Could not rouse her and in few seconds he woke up and felt ok. ?Daughter states that it took her a minute or 2 when she woke up to be oriented but she was fine after that. ?Patient sees neurologist for her dementia and she had an MRI when she was diagnosed with a meningioma but I am not sure what happened today with the MRI and suspecting that she had a episode of agitation secondary to her dementia. Son reports that his mom passed away yesterday and they have  a lot of things happening unfortunately.  ? ?Review of Systems  ?Unable to perform ROS: Dementia  ? ?Past Medical History:  ?Diagnosis Date  ? Bruit of left carotid artery   ? carotid doppler 04/2011 mild plaque formation only  ? Osteopenia   ? Pure hypercholesterolemia   ? Unspecified essential hypertension   ? ?Past Surgical History:  ?Procedure Laterality Date  ? cyst removed  1970  ? TONSILLECTOMY  1943  ? ?Social History:  reports that she has never smoked. She has never used smokeless tobacco. She reports that she does not drink alcohol and does not use drugs. ? ?No Known Allergies ? ?Family History  ?Problem Relation Age of Onset  ? Heart disease Mother   ? Heart disease Father   ? Emphysema Father   ? Heart disease Sister   ? Diabetes Sister   ? Heart disease Sister   ?     heart attack  ? Transient ischemic attack Sister   ? Stroke Brother   ? Emphysema  Brother   ? ? ?Prior to Admission medications   ?Medication Sig Start Date End Date Taking? Authorizing Provider  ?aspirin EC 81 MG tablet Take 81 mg by mouth daily.    [provider]  ?atorvastatin (LIPITOR) 10 MG tablet Take 1 tablet (10 mg total) by mouth daily. 04/24/17   Wardell Honour, MD  ?lisinopril (PRINIVIL,ZESTRIL) 5 MG tablet Take 1 tablet (5 mg total) by mouth daily. Office visit needed for refills 09/13/17   Wardell Honour, MD  ?Omega-3 Fatty Acids (FISH OIL PO) Take by mouth.    [provider]  ? ? ?Physical Exam: ?Vitals:  ? 12/23/21 1701 12/23/21 2035 12/23/21 2100 12/23/21 2130  ?BP: 124/87 (!) 149/67 (!) 158/75 (!) 151/80  ?Pulse: 66 (!) 58 (!) 52 (!) 55  ?Resp: '16 19 19 17  '$ ?Temp: 97.8 ?F (36.6 ?C)     ?TempSrc: Oral     ?SpO2: 98% 97% 97% 97%  ?Weight:      ?Height:      ?Physical Exam ?Vitals reviewed.  ?Constitutional:   ?   Appearance: She is not ill-appearing.  ?HENT:  ?   Head: Normocephalic and atraumatic.  ?   Nose: Nose normal.  ?Eyes:  ?   Extraocular Movements: Extraocular movements intact.  ?   Pupils: Pupils are equal, round, and  reactive to light.  ?Cardiovascular:  ?   Rate and Rhythm: Normal rate and regular rhythm.  ?   Pulses: Normal pulses.  ?   Heart sounds: Normal heart sounds.  ?Pulmonary:  ?   Effort: Pulmonary effort is normal.  ?   Breath sounds: Normal breath sounds.  ?Abdominal:  ?   General: Bowel sounds are normal.  ?   Palpations: Abdomen is soft.  ?Musculoskeletal:  ?   Right lower leg: No edema.  ?   Left lower leg: No edema.  ?Skin: ?   General: Skin is warm.  ?Neurological:  ?   General: No focal deficit present.  ?   Mental Status: She is alert. She is disoriented.  ?Psychiatric:     ?   Attention and Perception: Attention normal.     ?   Mood and Affect: Mood is anxious.     ?   Speech: Speech normal.     ?   Behavior: Behavior is cooperative.  ? ? ?Data Reviewed: ?Results for orders placed or performed during the hospital encounter of  12/23/21 (from the past 24 hour(s))  ?Comprehensive metabolic panel     Status: Abnormal  ? Collection Time: 12/23/21  5:06 PM  ?Result Value Ref Range  ? Sodium 143 135 - 145 mmol/L  ? Potassium 4.3 3.5 - 5.1 mmol/L  ? Chloride 107 98 - 111 mmol/L  ? CO2 27 22 - 32 mmol/L  ? Glucose, Bld 158 (H) 70 - 99 mg/dL  ? BUN 17 8 - 23 mg/dL  ? Creatinine, Ser 1.09 (H) 0.44 - 1.00 mg/dL  ? Calcium 9.6 8.9 - 10.3 mg/dL  ? Total Protein 6.7 6.5 - 8.1 g/dL  ? Albumin 4.0 3.5 - 5.0 g/dL  ? AST 24 15 - 41 U/L  ? ALT 16 0 - 44 U/L  ? Alkaline Phosphatase 78 38 - 126 U/L  ? Total Bilirubin 0.5 0.3 - 1.2 mg/dL  ? GFR, Estimated 50 (L) >60 mL/min  ? Anion gap 9 5 - 15  ?CBC with Differential     Status: None  ? Collection Time: 12/23/21  5:06 PM  ?Result Value Ref Range  ? WBC 6.2 4.0 - 10.5 K/uL  ? RBC 4.50 3.87 - 5.11 MIL/uL  ? Hemoglobin 13.1 12.0 - 15.0 g/dL  ? HCT 41.4 36.0 - 46.0 %  ? MCV 92.0 80.0 - 100.0 fL  ? MCH 29.1 26.0 - 34.0 pg  ? MCHC 31.6 30.0 - 36.0 g/dL  ? RDW 13.3 11.5 - 15.5 %  ? Platelets 258 150 - 400 K/uL  ? nRBC 0.0 0.0 - 0.2 %  ? Neutrophils Relative % 65 %  ? Neutro Abs 4.0 1.7 - 7.7 K/uL  ? Lymphocytes Relative 23 %  ? Lymphs Abs 1.4 0.7 - 4.0 K/uL  ? Monocytes Relative 7 %  ? Monocytes Absolute 0.5 0.1 - 1.0 K/uL  ? Eosinophils Relative 4 %  ? Eosinophils Absolute 0.3 0.0 - 0.5 K/uL  ? Basophils Relative 1 %  ? Basophils Absolute 0.1 0.0 - 0.1 K/uL  ? Immature Granulocytes 0 %  ? Abs Immature Granulocytes 0.02 0.00 - 0.07 K/uL  ?Protime-INR     Status: None  ? Collection Time: 12/23/21  5:06 PM  ?Result Value Ref Range  ? Prothrombin Time 13.0 11.4 - 15.2 seconds  ? INR 1.0 0.8 - 1.2  ?APTT     Status: None  ? Collection Time: 12/23/21  5:06  PM  ?Result Value Ref Range  ? aPTT 31 24 - 36 seconds  ?Troponin I (High Sensitivity)     Status: None  ? Collection Time: 12/23/21  5:06 PM  ?Result Value Ref Range  ? Troponin I (High Sensitivity) 4 <18 ng/L  ? ? ?Assessment and Plan: ?* Syncope and collapse ?D/w  family that we have to do mari and echo and carotid dopplers for syncope eval and if dimer elevated we may consider PE eval. ?Aspiration/ fall precaution.  ?  ? ?Dementia with behavioral disturbance ([place]) ?We will cont Remeron, Aricept and  Zoloft and ativan prn.  ? ?Anxiety ?Prn lorazepam from home regimen resumed.  ? ?Essential hypertension ?Blood pressure (!) 151/80, pulse (!) 55, temperature 97.8 ?F (36.6 ?C), temperature source Oral, resp. rate 17, height '4\' 11"'$  (1.499 m), weight 56.7 kg, SpO2 97 %. ?pt continued on lisinopril. . ? ? ? ? ? Advance Care Planning:  ?  Code Status: Full Code  ? ?Consults:  ?None  ? ?Family Communication:  ?Gean Quint (Daughter)  ?[phone] (Home Phone) ? ?Severity of Illness: ?The appropriate patient status for this patient is OBSERVATION. Observation status is judged to be reasonable and necessary in order to provide the required intensity of service to ensure the patient's safety. The patient's presenting symptoms, physical exam findings, and initial radiographic and laboratory data in the context of their medical condition is felt to place them at decreased risk for further clinical deterioration. Furthermore, it is anticipated that the patient will be medically stable for discharge from the hospital within 2 midnights of admission.  ? ?Author: ?Para Skeans, MD ?12/23/2021 11:44 PM ? ?For on call review [email or web address].  ?

## 2021-12-23 NOTE — Discharge Summary (Signed)
?Physician Discharge Summary ?  ?Patient: Susan Moss MRN: [id number] DOB: September 05, 1933  ?Admit date:     12/23/2021  ?Discharge date: 12/23/21  ?Discharge Physician: Para Skeans  ? ?PCP: [facility]  ? ?Recommendations at discharge:  ? ? Syncope and collapse: Discussed with daughter about syncopal event, continue risk presenting with pulmonary embolism and/or cardiac dysrhythmias and to have that evaluated by PCP and cardiology. ?       ? ?Discharge Diagnoses: ?Principal Problem: ?  Syncope and collapse ?Active Problems: ?  Dementia with behavioral disturbance ([place]) ?  Anxiety ?  Essential hypertension ? ? ?Hospital Course: ?Patient admitted for syncopal and collapse event today during her outing with her family while having a frosty. ?Since admission patient was an MRI and had an episode where she was screaming and got very scared and was crying.  Family was very upset with this episode and wanted to take mom back to her assisted living facility.  Family was not happy that she had been waiting here for 4 hours and not received her meds. ?Discussed with daughter that I am sorry that she was claustrophobic and had this anxiety episode. ?I did release start her home meds as soon as med rec was done and was released.  Suspect patient has claustrophobia as well.  But I did suggest that it is important that she gets evaluated by her PCP for possible blood clot as well.  Daughter and son were very understanding.  Patient was called and cooperative with family at bedside. ? ?Assessment and Plan: ?* Syncope and collapse ?D/w family that we have to do mari and echo and carotid dopplers for syncope eval and if dimer elevated we may consider PE eval. ?Aspiration/ fall precaution.  ?  ? ?Dementia with behavioral disturbance ([place]) ?We will cont Remeron, Aricept and  Zoloft and ativan prn.  ? ?Anxiety ?Prn lorazepam from home regimen resumed.  ? ?Essential hypertension ?Blood pressure (!) 151/80, pulse (!) 55,  temperature 97.8 ?F (36.6 ?C), temperature source Oral, resp. rate 17, height '4\' 11"'$  (1.499 m), weight 56.7 kg, SpO2 97 %. ?pt continued on lisinopril. . ? ? ?Pt after being admitted had anxiety attack or dementia related behavioral disturbance.  ?We will d/c pt per family request. ? ?Consultants: none  ?Procedures performed: none    ?Disposition: Assisted living ?Diet recommendation: Heart healthy.  ?Discharge Diet Orders (From admission, onward)  ? ?  Start     Ordered  ? 12/23/21 0000  Diet - low sodium heart healthy       ? 12/23/21 2255  ? ?  ?  ? ?  ? ?Cardiac diet ?DISCHARGE MEDICATION: ?Allergies as of 12/23/2021   ?No Known Allergies ?  ? ?  ?Medication List  ?  ? ?STOP taking these medications   ? ?atorvastatin 10 MG tablet ?Commonly known as: LIPITOR ?  ?FISH OIL PO ?  ? ?  ? ?TAKE these medications   ? ?aspirin EC 81 MG tablet ?Take 81 mg by mouth daily. ?  ?Cholecalciferol 25 MCG (1000 UT) capsule ?Take 1,000 Units by mouth daily. ?  ?donepezil 5 MG tablet ?Commonly known as: ARICEPT ?Take 5 mg by mouth at bedtime. ?  ?lisinopril 10 MG tablet ?Commonly known as: ZESTRIL ?Take 10 mg by mouth daily. ?What changed: Another medication with the same name was removed. Continue taking this medication, and follow the directions you see here. ?  ?LORazepam 0.5 MG tablet ?Commonly known as: ATIVAN ?Take  0.5 mg by mouth 3 (three) times daily as needed for anxiety (agitation). ?  ?melatonin 3 MG Tabs tablet ?Take 3 mg by mouth at bedtime. ?  ?mirtazapine 7.5 MG tablet ?Commonly known as: REMERON ?Take 7.5 mg by mouth at bedtime. ?  ?sertraline 50 MG tablet ?Commonly known as: ZOLOFT ?Take 75 mg by mouth daily. ?  ?simvastatin 20 MG tablet ?Commonly known as: ZOCOR ?Take 20 mg by mouth at bedtime. ?  ? ?  ? ? ?Discharge Exam: ?Filed Weights  ? 12/23/21 1659  ?Weight: 56.7 kg  ? ?Physical Exam ?Vitals and nursing note reviewed.  ?Constitutional:   ?   General: She is not in acute distress. ?   Appearance: Normal  appearance. She is not ill-appearing, toxic-appearing or diaphoretic.  ?HENT:  ?   Head: Normocephalic and atraumatic.  ?   Right Ear: Hearing and external ear normal.  ?   Left Ear: Hearing and external ear normal.  ?   Nose: Nose normal. No nasal deformity.  ?   Mouth/Throat:  ?   Lips: Pink.  ?   Mouth: Mucous membranes are moist.  ?   Tongue: No lesions.  ?   Pharynx: Oropharynx is clear.  ?Eyes:  ?   Extraocular Movements: Extraocular movements intact.  ?Neck:  ?   Vascular: No carotid bruit.  ?Cardiovascular:  ?   Rate and Rhythm: Normal rate and regular rhythm.  ?   Pulses: Normal pulses.  ?   Heart sounds: Normal heart sounds.  ?Pulmonary:  ?   Effort: Pulmonary effort is normal.  ?   Breath sounds: Normal breath sounds.  ?Abdominal:  ?   General: Bowel sounds are normal. There is no distension.  ?   Palpations: Abdomen is soft. There is no mass.  ?   Tenderness: There is no abdominal tenderness. There is no guarding.  ?   Hernia: No hernia is present.  ?Musculoskeletal:  ?   Right lower leg: No edema.  ?   Left lower leg: No edema.  ?Skin: ?   General: Skin is warm.  ?Neurological:  ?   General: No focal deficit present.  ?   Mental Status: She is alert. She is disoriented.  ?   Cranial Nerves: Cranial nerves 2-12 are intact.  ?   Motor: Motor function is intact.  ?Psychiatric:     ?   Attention and Perception: Attention normal.     ?   Mood and Affect: Mood normal.     ?   Speech: Speech normal.     ?   Behavior: Behavior normal. Behavior is cooperative.     ?   Cognition and Memory: Cognition normal.  ? ? ? ?Condition at discharge: stable ? ?The results of significant diagnostics from this hospitalization (including imaging, microbiology, ancillary and laboratory) are listed below for reference.  ? ?Imaging Studies: ?DG Chest 2 View ? ?Result Date: 12/23/2021 ?CLINICAL DATA:  Syncope seizure EXAM: CHEST - 2 VIEW COMPARISON:  None Available. FINDINGS: The heart size and mediastinal contours are within normal  limits. Both lungs are clear aortic atherosclerosis. The visualized skeletal structures are unremarkable. IMPRESSION: No active cardiopulmonary disease. Electronically Signed   By: Donavan Foil M.D.   On: 12/23/2021 17:52  ? ?CT Head Wo Contrast ? ?Result Date: 12/23/2021 ?CLINICAL DATA:  Mental status change.  Unresponsive EXAM: CT HEAD WITHOUT CONTRAST TECHNIQUE: Contiguous axial images were obtained from the base of the skull through the vertex without  intravenous contrast. RADIATION DOSE REDUCTION: This exam was performed according to the departmental dose-optimization program which includes automated exposure control, adjustment of the mA and/or kV according to patient size and/or use of iterative reconstruction technique. COMPARISON:  MRI head with contrast 12/07/2019 FINDINGS: Brain: Generalized atrophy. Negative for intracranial hemorrhage or acute infarct Left frontal extra-axial mass measuring 22 x 10 mm compatible with meningioma. Mild growth since the prior study. No brain edema Vascular: Negative for hyperdense vessel Skull: Negative Sinuses/Orbits: Negative Other: None IMPRESSION: No acute abnormality Left frontal meningioma with mild growth since the prior MRI 2019. Electronically Signed   By: Franchot Gallo M.D.   On: 12/23/2021 17:38  ? ?MR BRAIN WO CONTRAST ? ?Result Date: 12/23/2021 ?CLINICAL DATA:  Altered mental status EXAM: MRI HEAD WITHOUT CONTRAST TECHNIQUE: Multiplanar, multiecho pulse sequences of the brain and surrounding structures were obtained without intravenous contrast. COMPARISON:  12/07/2019 FINDINGS: Examination was discontinued prior to completion by patient request. Images are severely motion degraded. Brain: No acute infarct, mass effect or extra-axial collection. There is multifocal hyperintense T2-weighted signal within the white matter. Generalized cerebral volume loss. There is a superior left convexity meningioma measuring 17 mm, previously 12 mm. Vascular: Major flow voids  are preserved. Skull and upper cervical spine: Normal calvarium and skull base. Visualized upper cervical spine and soft tissues are normal. Sinuses/Orbits:No paranasal sinus fluid levels or advanced mucosal thic

## 2021-12-24 LAB — TSH: TSH: 0.6 u[IU]/mL (ref 0.350–4.500)

## 2021-12-24 LAB — T4, FREE: Free T4: 0.81 ng/dL (ref 0.61–1.12)
